# Patient Record
Sex: Female | Born: 1955 | Race: White | Hispanic: No | Smoking: Never smoker
Health system: Southern US, Community
[De-identification: ages and names within clinical notes are randomized; demographics above are authoritative.]

## PROBLEM LIST (undated history)

## (undated) DIAGNOSIS — D649 Anemia, unspecified: Secondary | ICD-10-CM

## (undated) DIAGNOSIS — M26629 Arthralgia of temporomandibular joint, unspecified side: Secondary | ICD-10-CM

## (undated) DIAGNOSIS — I341 Nonrheumatic mitral (valve) prolapse: Secondary | ICD-10-CM

## (undated) DIAGNOSIS — D72820 Lymphocytosis (symptomatic): Secondary | ICD-10-CM

## (undated) DIAGNOSIS — C73 Malignant neoplasm of thyroid gland: Secondary | ICD-10-CM

## (undated) DIAGNOSIS — E7800 Pure hypercholesterolemia, unspecified: Secondary | ICD-10-CM

## (undated) DIAGNOSIS — D239 Other benign neoplasm of skin, unspecified: Secondary | ICD-10-CM

## (undated) DIAGNOSIS — I499 Cardiac arrhythmia, unspecified: Secondary | ICD-10-CM

## (undated) DIAGNOSIS — R1310 Dysphagia, unspecified: Secondary | ICD-10-CM

## (undated) DIAGNOSIS — M8580 Other specified disorders of bone density and structure, unspecified site: Secondary | ICD-10-CM

## (undated) DIAGNOSIS — Z961 Presence of intraocular lens: Secondary | ICD-10-CM

## (undated) DIAGNOSIS — L570 Actinic keratosis: Secondary | ICD-10-CM

## (undated) DIAGNOSIS — E039 Hypothyroidism, unspecified: Secondary | ICD-10-CM

## (undated) DIAGNOSIS — R011 Cardiac murmur, unspecified: Secondary | ICD-10-CM

## (undated) DIAGNOSIS — K219 Gastro-esophageal reflux disease without esophagitis: Secondary | ICD-10-CM

## (undated) DIAGNOSIS — M1990 Unspecified osteoarthritis, unspecified site: Secondary | ICD-10-CM

## (undated) DIAGNOSIS — H3320 Serous retinal detachment, unspecified eye: Secondary | ICD-10-CM

## (undated) HISTORY — DX: Other specified disorders of bone density and structure, unspecified site: M85.80

## (undated) HISTORY — PX: THYROIDECTOMY: SHX17

## (undated) HISTORY — PX: ABDOMINAL HYSTERECTOMY: SHX81

## (undated) HISTORY — DX: Actinic keratosis: L57.0

## (undated) HISTORY — PX: EYE SURGERY: SHX253

## (undated) HISTORY — PX: TONSILLECTOMY: SUR1361

## (undated) HISTORY — DX: Lymphocytosis (symptomatic): D72.820

---
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
## 2006-07-27 ENCOUNTER — Other Ambulatory Visit: Payer: Self-pay

## 2006-07-30 ENCOUNTER — Ambulatory Visit: Payer: Self-pay | Admitting: Obstetrics and Gynecology

## 2006-08-27 ENCOUNTER — Ambulatory Visit: Payer: Self-pay | Admitting: Obstetrics and Gynecology

## 2006-11-15 ENCOUNTER — Ambulatory Visit: Payer: Self-pay | Admitting: Ophthalmology

## 2007-01-16 ENCOUNTER — Emergency Department: Payer: Self-pay | Admitting: Emergency Medicine

## 2007-01-16 ENCOUNTER — Other Ambulatory Visit: Payer: Self-pay

## 2007-12-05 ENCOUNTER — Ambulatory Visit: Payer: Self-pay | Admitting: Gastroenterology

## 2009-09-09 ENCOUNTER — Ambulatory Visit: Payer: Self-pay | Admitting: Family Medicine

## 2009-09-29 ENCOUNTER — Ambulatory Visit: Payer: Self-pay

## 2009-10-13 DIAGNOSIS — C73 Malignant neoplasm of thyroid gland: Secondary | ICD-10-CM

## 2009-10-13 HISTORY — DX: Malignant neoplasm of thyroid gland: C73

## 2010-01-17 ENCOUNTER — Ambulatory Visit: Payer: Self-pay | Admitting: Gastroenterology

## 2010-06-29 ENCOUNTER — Ambulatory Visit: Payer: Self-pay | Admitting: Internal Medicine

## 2010-09-12 ENCOUNTER — Ambulatory Visit: Payer: Self-pay | Admitting: Family Medicine

## 2010-10-23 ENCOUNTER — Ambulatory Visit: Payer: Self-pay | Admitting: Family Medicine

## 2011-09-26 ENCOUNTER — Ambulatory Visit: Payer: Self-pay | Admitting: Family Medicine

## 2011-10-10 ENCOUNTER — Ambulatory Visit: Payer: Self-pay | Admitting: Family Medicine

## 2012-09-26 ENCOUNTER — Ambulatory Visit: Payer: Self-pay | Admitting: Family Medicine

## 2013-11-17 ENCOUNTER — Ambulatory Visit: Payer: Self-pay | Admitting: Family Medicine

## 2014-11-18 ENCOUNTER — Ambulatory Visit: Payer: Self-pay | Admitting: Family Medicine

## 2015-01-14 ENCOUNTER — Ambulatory Visit: Payer: Self-pay | Admitting: Gastroenterology

## 2015-10-15 ENCOUNTER — Encounter: Payer: Self-pay | Admitting: *Deleted

## 2015-10-18 ENCOUNTER — Encounter
Admission: RE | Disposition: A | Discharge status: Home Health | Payer: Self-pay | Source: Ambulatory Visit | Attending: Gastroenterology

## 2015-10-18 ENCOUNTER — Ambulatory Visit
Admission: RE | Admit: 2015-10-18 | Discharge: 2015-10-18 | Disposition: A | Discharge status: Home Health | Payer: BC Managed Care – PPO | Source: Ambulatory Visit | Attending: Gastroenterology | Admitting: Gastroenterology

## 2015-10-18 ENCOUNTER — Ambulatory Visit: Payer: BC Managed Care – PPO | Admitting: Anesthesiology

## 2015-10-18 ENCOUNTER — Encounter: Payer: Self-pay | Admitting: *Deleted

## 2015-10-18 DIAGNOSIS — M19071 Primary osteoarthritis, right ankle and foot: Secondary | ICD-10-CM | POA: Insufficient documentation

## 2015-10-18 DIAGNOSIS — K219 Gastro-esophageal reflux disease without esophagitis: Secondary | ICD-10-CM | POA: Insufficient documentation

## 2015-10-18 DIAGNOSIS — Z86010 Personal history of colonic polyps: Secondary | ICD-10-CM | POA: Insufficient documentation

## 2015-10-18 DIAGNOSIS — M26601 Right temporomandibular joint disorder, unspecified: Secondary | ICD-10-CM | POA: Diagnosis not present

## 2015-10-18 DIAGNOSIS — R1310 Dysphagia, unspecified: Secondary | ICD-10-CM | POA: Diagnosis present

## 2015-10-18 DIAGNOSIS — Z79899 Other long term (current) drug therapy: Secondary | ICD-10-CM | POA: Insufficient documentation

## 2015-10-18 DIAGNOSIS — M19072 Primary osteoarthritis, left ankle and foot: Secondary | ICD-10-CM | POA: Diagnosis not present

## 2015-10-18 DIAGNOSIS — I341 Nonrheumatic mitral (valve) prolapse: Secondary | ICD-10-CM | POA: Insufficient documentation

## 2015-10-18 DIAGNOSIS — Z85850 Personal history of malignant neoplasm of thyroid: Secondary | ICD-10-CM | POA: Insufficient documentation

## 2015-10-18 DIAGNOSIS — M170 Bilateral primary osteoarthritis of knee: Secondary | ICD-10-CM | POA: Diagnosis not present

## 2015-10-18 HISTORY — DX: Gastro-esophageal reflux disease without esophagitis: K21.9

## 2015-10-18 HISTORY — DX: Anemia, unspecified: D64.9

## 2015-10-18 HISTORY — DX: Presence of intraocular lens: Z96.1

## 2015-10-18 HISTORY — DX: Cardiac murmur, unspecified: R01.1

## 2015-10-18 HISTORY — DX: Hypothyroidism, unspecified: E03.9

## 2015-10-18 HISTORY — DX: Arthralgia of temporomandibular joint, unspecified side: M26.629

## 2015-10-18 HISTORY — DX: Serous retinal detachment, unspecified eye: H33.20

## 2015-10-18 HISTORY — DX: Pure hypercholesterolemia, unspecified: E78.00

## 2015-10-18 HISTORY — DX: Cardiac arrhythmia, unspecified: I49.9

## 2015-10-18 HISTORY — DX: Unspecified osteoarthritis, unspecified site: M19.90

## 2015-10-18 HISTORY — DX: Nonrheumatic mitral (valve) prolapse: I34.1

## 2015-10-18 HISTORY — PX: ESOPHAGOGASTRODUODENOSCOPY (EGD) WITH PROPOFOL: SHX5813

## 2015-10-18 HISTORY — DX: Dysphagia, unspecified: R13.10

## 2015-10-18 SURGERY — ESOPHAGOGASTRODUODENOSCOPY (EGD) WITH PROPOFOL
Anesthesia: General

## 2015-10-18 MED ORDER — CEFAZOLIN SODIUM 1-5 GM-% IV SOLN
1.0000 g | Freq: Once | INTRAVENOUS | Status: AC
  Administered 2015-10-18: 1 g via INTRAVENOUS
  Filled 2015-10-18: qty 50

## 2015-10-18 MED ORDER — GLYCOPYRROLATE 0.2 MG/ML IJ SOLN
INTRAMUSCULAR | Status: DC | PRN
  Administered 2015-10-18: 0.2 mg via INTRAVENOUS

## 2015-10-18 MED ORDER — MIDAZOLAM HCL 2 MG/2ML IJ SOLN
INTRAMUSCULAR | Status: DC | PRN
  Administered 2015-10-18: 1 mg via INTRAVENOUS

## 2015-10-18 MED ORDER — PROPOFOL 500 MG/50ML IV EMUL
INTRAVENOUS | Status: DC | PRN
  Administered 2015-10-18: 80 ug/kg/min via INTRAVENOUS

## 2015-10-18 MED ORDER — SODIUM CHLORIDE 0.9 % IV SOLN
INTRAVENOUS | Status: DC
Start: 2015-10-18 — End: 2015-10-18

## 2015-10-18 MED ORDER — SODIUM CHLORIDE 0.9 % IV SOLN
INTRAVENOUS | Status: DC
Start: 2015-10-18 — End: 2015-10-18
  Administered 2015-10-18: 1000 mL via INTRAVENOUS
  Administered 2015-10-18: 11:00:00 via INTRAVENOUS

## 2015-10-18 MED ORDER — PROPOFOL 10 MG/ML IV BOLUS
INTRAVENOUS | Status: DC | PRN
  Administered 2015-10-18: 20 mg via INTRAVENOUS
  Administered 2015-10-18: 10 mg via INTRAVENOUS

## 2015-10-18 NOTE — H&P (Signed)
  Date of Initial H&P:09/24/2015  History reviewed, patient examined, no change in status, stable for surgery.

## 2015-10-18 NOTE — Anesthesia Postprocedure Evaluation (Signed)
Anesthesia Post Note  Patient: Lisa Russell  Procedure(s) Performed: Procedure(s) (LRB): ESOPHAGOGASTRODUODENOSCOPY (EGD) WITH PROPOFOL (N/A)  Patient location during evaluation: Endoscopy Anesthesia Type: General Level of consciousness: awake and alert Pain management: pain level controlled Vital Signs Assessment: post-procedure vital signs reviewed and stable Respiratory status: spontaneous breathing, nonlabored ventilation, respiratory function stable and patient connected to nasal cannula oxygen Cardiovascular status: blood pressure returned to baseline and stable Postop Assessment: no signs of nausea or vomiting Anesthetic complications: no    Last Vitals:  Filed Vitals:   10/18/15 1200 10/18/15 1210  BP: 109/55 109/65  Pulse: 54 57  Temp:    Resp: 15 20    Last Pain: There were no vitals filed for this visit.               Martha Clan

## 2015-10-18 NOTE — Anesthesia Preprocedure Evaluation (Signed)
Anesthesia Evaluation  Patient identified by MRN, date of birth, ID band Patient awake    Reviewed: Allergy & Precautions, H&P , NPO status , Patient's Chart, lab work & pertinent test results, reviewed documented beta blocker date and time   History of Anesthesia Complications Negative for: history of anesthetic complications  Airway Mallampati: II  TM Distance: >3 FB Neck ROM: full    Dental no notable dental hx. (+) Caps, Missing   Pulmonary neg pulmonary ROS,    Pulmonary exam normal breath sounds clear to auscultation       Cardiovascular Exercise Tolerance: Good (-) hypertension(-) angina(-) CAD, (-) Past MI, (-) Cardiac Stents and (-) CABG Normal cardiovascular exam+ dysrhythmias + Valvular Problems/Murmurs MVP  Rhythm:regular Rate:Normal     Neuro/Psych negative neurological ROS  negative psych ROS   GI/Hepatic Neg liver ROS, GERD  Medicated and Poorly Controlled,  Endo/Other  neg diabetesHypothyroidism   Renal/GU negative Renal ROS  negative genitourinary   Musculoskeletal   Abdominal   Peds  Hematology negative hematology ROS (+)   Anesthesia Other Findings Past Medical History:   Anemia                                                       Dysrhythmia                                                  Arthritis                                                    Heart murmur                                                 Mitral valve prolapse                                        Retinal detachment                                           Cancer (HCC)                                                   Comment:thyroid   TMJ arthralgia                                               Hypercholesteremia  GERD (gastroesophageal reflux disease)                       Dysphagia                                                    Pseudophakia of both eyes                                     Hypothyroidism                                               Reproductive/Obstetrics negative OB ROS                             Anesthesia Physical Anesthesia Plan  ASA: II  Anesthesia Plan: General   Post-op Pain Management:    Induction:   Airway Management Planned:   Additional Equipment:   Intra-op Plan:   Post-operative Plan:   Informed Consent: I have reviewed the patients History and Physical, chart, labs and discussed the procedure including the risks, benefits and alternatives for the proposed anesthesia with the patient or authorized representative who has indicated his/her understanding and acceptance.   Dental Advisory Given  Plan Discussed with: Anesthesiologist, CRNA and Surgeon  Anesthesia Plan Comments:         Anesthesia Quick Evaluation

## 2015-10-18 NOTE — Transfer of Care (Signed)
Immediate Anesthesia Transfer of Care Note  Patient: Lisa Russell  Procedure(s) Performed: Procedure(s): ESOPHAGOGASTRODUODENOSCOPY (EGD) WITH PROPOFOL (N/A)  Patient Location: PACU  Anesthesia Type:General  Level of Consciousness: awake, alert  and oriented  Airway & Oxygen Therapy: Patient Spontanous Breathing and Patient connected to nasal cannula oxygen  Post-op Assessment: Report given to RN and Post -op Vital signs reviewed and stable  Post vital signs: Reviewed and stable  Last Vitals:  Filed Vitals:   10/18/15 1005  BP: 118/63  Pulse: 63  Temp: 37 C  Resp: 18    Complications: No apparent anesthesia complications

## 2015-10-18 NOTE — Op Note (Signed)
[facility] Gastroenterology Patient Name: Lisa Russell Procedure Date: 10/18/2015 11:23 AM MRN: [id number] Account #: [id number] Date of Birth: 09-28-1956 Admit Type: Outpatient Age: 59 Room: [facility] ENDO ROOM 4 Gender: Female Note Status: Finalized Procedure:         Upper GI endoscopy Indications:       Suspected esophageal reflux, Symptoms better with protonix Providers:         Lupita Dawn. Candace Cruise, MD Referring MD:      Gayland Curry, MD (Referring MD) Medicines:         Monitored Anesthesia Care Complications:     No immediate complications. Procedure:         Pre-Anesthesia Assessment:                    - Prior to the procedure, a History and Physical was                     performed, and patient medications, allergies and                     sensitivities were reviewed. The patient's tolerance of                     previous anesthesia was reviewed.                    - The risks and benefits of the procedure and the sedation                     options and risks were discussed with the patient. All                     questions were answered and informed consent was obtained.                    - After reviewing the risks and benefits, the patient was                     deemed in satisfactory condition to undergo the procedure.                    After obtaining informed consent, the endoscope was passed                     under direct vision. Throughout the procedure, the                     patient's blood pressure, pulse, and oxygen saturations                     were monitored continuously. The Endoscope was introduced                     through the mouth, and advanced to the second part of                     duodenum. The upper GI endoscopy was accomplished without                     difficulty. The patient tolerated the procedure well. Findings:      The examined esophagus was normal. Biopsies were taken with a cold       forceps for histology.    The entire examined stomach was normal.  The examined duodenum was normal. Impression:        - Normal esophagus. Biopsied.                    - Normal stomach.                    - Normal examined duodenum. Recommendation:    - Discharge patient to home.                    - Observe patient's clinical course.                    - The findings and recommendations were discussed with the                     patient.                    - Await pathology results. Procedure Code(s): --- Professional ---                    [phone], Esophagogastroduodenoscopy, flexible, transoral;                     with biopsy, single or multiple CPT copyright 2014 American Medical Association. All rights reserved. The codes documented in this report are preliminary and upon coder review may  be revised to meet current compliance requirements. Hulen Luster, MD 10/18/2015 11:33:35 AM This report has been signed electronically. Number of Addenda: 0 Note Initiated On: 10/18/2015 11:23 AM      [facility]

## 2015-10-20 LAB — SURGICAL PATHOLOGY

## 2015-11-11 ENCOUNTER — Other Ambulatory Visit: Payer: Self-pay | Admitting: Family Medicine

## 2015-11-11 DIAGNOSIS — Z1231 Encounter for screening mammogram for malignant neoplasm of breast: Secondary | ICD-10-CM

## 2015-11-22 ENCOUNTER — Ambulatory Visit: Payer: BC Managed Care – PPO

## 2015-11-24 ENCOUNTER — Ambulatory Visit
Admission: RE | Admit: 2015-11-24 | Discharge: 2015-11-24 | Disposition: A | Discharge status: Home / Self Care | Payer: BC Managed Care – PPO | Source: Ambulatory Visit | Attending: Family Medicine | Admitting: Family Medicine

## 2015-11-24 DIAGNOSIS — Z1231 Encounter for screening mammogram for malignant neoplasm of breast: Secondary | ICD-10-CM | POA: Diagnosis present

## 2016-08-31 ENCOUNTER — Other Ambulatory Visit
Admission: RE | Admit: 2016-08-31 | Discharge: 2016-08-31 | Disposition: A | Discharge status: Home / Self Care | Payer: BC Managed Care – PPO | Source: Ambulatory Visit | Attending: Gastroenterology | Admitting: Gastroenterology

## 2016-08-31 DIAGNOSIS — R197 Diarrhea, unspecified: Secondary | ICD-10-CM | POA: Insufficient documentation

## 2016-08-31 LAB — GASTROINTESTINAL PANEL BY PCR, STOOL (REPLACES STOOL CULTURE)
ASTROVIRUS: NOT DETECTED
Adenovirus F40/41: NOT DETECTED
Campylobacter species: NOT DETECTED
Cryptosporidium: NOT DETECTED
Cyclospora cayetanensis: NOT DETECTED
E. COLI O157: NOT DETECTED
ENTEROTOXIGENIC E COLI (ETEC): NOT DETECTED
Entamoeba histolytica: NOT DETECTED
Enteroaggregative E coli (EAEC): NOT DETECTED
Enteropathogenic E coli (EPEC): NOT DETECTED
Giardia lamblia: NOT DETECTED
NOROVIRUS GI/GII: NOT DETECTED
Plesimonas shigelloides: NOT DETECTED
ROTAVIRUS A: NOT DETECTED
SAPOVIRUS (I, II, IV, AND V): NOT DETECTED
SHIGA LIKE TOXIN PRODUCING E COLI (STEC): NOT DETECTED
Salmonella species: NOT DETECTED
Shigella/Enteroinvasive E coli (EIEC): NOT DETECTED
Vibrio cholerae: NOT DETECTED
Vibrio species: NOT DETECTED
Yersinia enterocolitica: NOT DETECTED

## 2016-08-31 LAB — C DIFFICILE QUICK SCREEN W PCR REFLEX
C DIFFICILE (CDIFF) INTERP: NOT DETECTED
C DIFFICILE (CDIFF) TOXIN: NEGATIVE
C DIFFICLE (CDIFF) ANTIGEN: NEGATIVE

## 2016-09-03 ENCOUNTER — Encounter: Payer: Self-pay | Admitting: Emergency Medicine

## 2016-09-03 ENCOUNTER — Emergency Department
Admission: EM | Admit: 2016-09-03 | Discharge: 2016-09-03 | Disposition: A | Discharge status: Home / Self Care | Payer: BC Managed Care – PPO | Attending: Emergency Medicine | Admitting: Emergency Medicine

## 2016-09-03 DIAGNOSIS — E039 Hypothyroidism, unspecified: Secondary | ICD-10-CM | POA: Insufficient documentation

## 2016-09-03 DIAGNOSIS — E860 Dehydration: Secondary | ICD-10-CM | POA: Diagnosis present

## 2016-09-03 DIAGNOSIS — R197 Diarrhea, unspecified: Secondary | ICD-10-CM | POA: Diagnosis not present

## 2016-09-03 DIAGNOSIS — Z85850 Personal history of malignant neoplasm of thyroid: Secondary | ICD-10-CM | POA: Diagnosis not present

## 2016-09-03 LAB — URINALYSIS COMPLETE WITH MICROSCOPIC (ARMC ONLY)
Bilirubin Urine: NEGATIVE
Glucose, UA: NEGATIVE mg/dL
Hgb urine dipstick: NEGATIVE
Ketones, ur: NEGATIVE mg/dL
Nitrite: NEGATIVE
PH: 5 (ref 5.0–8.0)
Protein, ur: NEGATIVE mg/dL
Specific Gravity, Urine: 1.004 — ABNORMAL LOW (ref 1.005–1.030)

## 2016-09-03 LAB — CBC
HEMATOCRIT: 41 % (ref 35.0–47.0)
Hemoglobin: 14.5 g/dL (ref 12.0–16.0)
MCH: 31.5 pg (ref 26.0–34.0)
MCHC: 35.3 g/dL (ref 32.0–36.0)
MCV: 89.4 fL (ref 80.0–100.0)
Platelets: 294 10*3/uL (ref 150–440)
RBC: 4.59 MIL/uL (ref 3.80–5.20)
RDW: 12.4 % (ref 11.5–14.5)
WBC: 7.1 10*3/uL (ref 3.6–11.0)

## 2016-09-03 LAB — COMPREHENSIVE METABOLIC PANEL
ALK PHOS: 79 U/L (ref 38–126)
ALT: 12 U/L — AB (ref 14–54)
ANION GAP: 9 (ref 5–15)
AST: 17 U/L (ref 15–41)
Albumin: 4.2 g/dL (ref 3.5–5.0)
BILIRUBIN TOTAL: 0.5 mg/dL (ref 0.3–1.2)
BUN: 9 mg/dL (ref 6–20)
CHLORIDE: 105 mmol/L (ref 101–111)
CO2: 26 mmol/L (ref 22–32)
Calcium: 9.3 mg/dL (ref 8.9–10.3)
Creatinine, Ser: 0.58 mg/dL (ref 0.44–1.00)
GLUCOSE: 103 mg/dL — AB (ref 65–99)
POTASSIUM: 3.2 mmol/L — AB (ref 3.5–5.1)
SODIUM: 140 mmol/L (ref 135–145)
Total Protein: 7.7 g/dL (ref 6.5–8.1)

## 2016-09-03 LAB — TSH: TSH: 4.943 u[IU]/mL — AB (ref 0.350–4.500)

## 2016-09-03 LAB — T4, FREE: FREE T4: 0.84 ng/dL (ref 0.61–1.12)

## 2016-09-03 LAB — LIPASE, BLOOD: Lipase: 17 U/L (ref 11–51)

## 2016-09-03 MED ORDER — DIPHENOXYLATE-ATROPINE 2.5-0.025 MG PO TABS: 1.0000 | ORAL_TABLET | Freq: Four times a day (QID) | ORAL | 1 refills | Status: AC | PRN

## 2016-09-03 MED ORDER — DIPHENOXYLATE-ATROPINE 2.5-0.025 MG PO TABS
2.0000 | ORAL_TABLET | Freq: Once | ORAL | Status: AC
  Administered 2016-09-03: 2 via ORAL
  Filled 2016-09-03: qty 2

## 2016-09-03 NOTE — ED Notes (Signed)
See triage note  States she had lab work and stool studies done  Which were normal  But feels weak  Denies any new food or meds  But has been remodeling an old house

## 2016-09-03 NOTE — ED Provider Notes (Signed)
[facility] Emergency Department Provider Note        Time seen: ----------------------------------------- 2:39 PM on 09/03/2016 -----------------------------------------    I have reviewed the triage vital signs and the nursing notes.   HISTORY  Chief Complaint Dehydration    HPI Lisa Russell is a 60 y.o. female who presents to the ER for weight loss and diarrhea that has been persistent over the past several weeks. Nothing makes it better, taking anything by mouth makes it worse. This is watery, she was just seen by gastroenterology and had a gastrointestinal panel and C. difficile test which were negative. She has recently had increased stress, she been taken Imodium without improvement. He denies any abdominal pain.   Past Medical History:  Diagnosis Date  . Anemia   . Arthritis   . Cancer ([place])    thyroid  . Dysphagia   . Dysrhythmia   . GERD (gastroesophageal reflux disease)   . Heart murmur   . Hypercholesteremia   . Hypothyroidism   . Mitral valve prolapse   . Pseudophakia of both eyes   . Retinal detachment   . TMJ arthralgia     There are no active problems to display for this patient.   Past Surgical History:  Procedure Laterality Date  . ABDOMINAL HYSTERECTOMY    . ESOPHAGOGASTRODUODENOSCOPY (EGD) WITH PROPOFOL N/A 10/18/2015   Procedure: ESOPHAGOGASTRODUODENOSCOPY (EGD) WITH PROPOFOL;  Surgeon: Hulen Luster, MD;  Location: [facility] ENDOSCOPY;  Service: Gastroenterology;  Laterality: N/A;  . EYE SURGERY    . THYROIDECTOMY    . TONSILLECTOMY      Allergies Lexapro [escitalopram oxalate]  Social History Social History  Substance Use Topics  . Smoking status: Never Smoker  . Smokeless tobacco: Never Used  . Alcohol use Not on file    Review of Systems Constitutional: Negative for fever. Cardiovascular: Negative for chest pain. Respiratory: Negative for shortness of breath. Gastrointestinal: Negative for abdominal pain,  vomiting. Positive for diarrhea Genitourinary: Negative for dysuria. Musculoskeletal: Negative for back pain. Skin: Negative for rash. Neurological: Negative for headaches, focal weakness or numbness.  10-point ROS otherwise negative.  ____________________________________________   PHYSICAL EXAM:  VITAL SIGNS: ED Triage Vitals  Enc Vitals Group     BP 09/03/16 1327 102/86     Pulse Rate 09/03/16 1327 66     Resp 09/03/16 1327 20     Temp 09/03/16 1327 98.5 F (36.9 C)     Temp Source 09/03/16 1327 Oral     SpO2 09/03/16 1327 97 %     Weight 09/03/16 1326 170 lb (77.1 kg)     Height 09/03/16 1326 5\' 5"  (1.651 m)     Head Circumference --      Peak Flow --      Pain Score --      Pain Loc --      Pain Edu? --      Excl. in [place]? --     Constitutional: Alert and oriented. Well appearing and in no distress. Eyes: Conjunctivae are normal. PERRL. Normal extraocular movements. ENT   Head: Normocephalic and atraumatic.   Nose: No congestion/rhinnorhea.   Mouth/Throat: Mucous membranes are moist.   Neck: No stridor. Cardiovascular: Normal rate, regular rhythm. No murmurs, rubs, or gallops. Respiratory: Normal respiratory effort without tachypnea nor retractions. Breath sounds are clear and equal bilaterally. No wheezes/rales/rhonchi. Gastrointestinal: Soft and nontender. Normal bowel sounds Musculoskeletal: Nontender with normal range of motion in all extremities. No lower extremity tenderness  nor edema. Neurologic:  Normal speech and language. No gross focal neurologic deficits are appreciated.  Skin:  Skin is warm, dry and intact. No rash noted. Psychiatric: Mood and affect are normal. Speech and behavior are normal.  ___________________________________________  ED COURSE:  Pertinent labs & imaging results that were available during my care of the patient were reviewed by me and considered in my medical decision making (see chart for details). Clinical Course   Patient presents to the ER no acute distress, we will assess with basic labs and reevaluate.  Procedures ____________________________________________   LABS (pertinent positives/negatives)  Labs Reviewed  COMPREHENSIVE METABOLIC PANEL - Abnormal; Notable for the following:       Result Value   Potassium 3.2 (*)    Glucose, Bld 103 (*)    ALT 12 (*)    All other components within normal limits  URINALYSIS COMPLETEWITH MICROSCOPIC ([facility] ONLY) - Abnormal; Notable for the following:    Color, Urine YELLOW (*)    APPearance HAZY (*)    Specific Gravity, Urine 1.004 (*)    Leukocytes, UA TRACE (*)    Bacteria, UA FEW (*)    Squamous Epithelial / LPF 0-5 (*)    All other components within normal limits  TSH - Abnormal; Notable for the following:    TSH 4.943 (*)    All other components within normal limits  LIPASE, BLOOD  CBC  T4, FREE  ____________________________________________  FINAL ASSESSMENT AND PLAN  Diarrhea  Plan: Patient with labs as dictated above. No specific etiology for her diarrhea is identified. Currently she is improved after taking Lomotil. She is clearly under a lot of stress and I have offered anxiolytics which she has declined. She will be placed on Lomotil and referred back to GI for close outpatient follow-up.   Earleen Newport, MD   Note: This dictation was prepared with Dragon dictation. Any transcriptional errors that result from this process are unintentional    Earleen Newport, MD 09/03/16 [id number]

## 2016-09-03 NOTE — ED Triage Notes (Signed)
States she has had weight loss over the past few weeks  Now having diarrhea and feeling weak  States everything she eats or drinks runs thur her   Has been seen by PCP for same

## 2016-09-06 ENCOUNTER — Other Ambulatory Visit
Admission: RE | Admit: 2016-09-06 | Discharge: 2016-09-06 | Disposition: A | Discharge status: Home / Self Care | Payer: BC Managed Care – PPO | Source: Other Acute Inpatient Hospital | Attending: Gastroenterology | Admitting: Gastroenterology

## 2016-09-06 ENCOUNTER — Other Ambulatory Visit: Payer: Self-pay | Admitting: Gastroenterology

## 2016-09-06 DIAGNOSIS — K529 Noninfective gastroenteritis and colitis, unspecified: Secondary | ICD-10-CM | POA: Diagnosis not present

## 2016-09-06 DIAGNOSIS — R634 Abnormal weight loss: Secondary | ICD-10-CM

## 2016-09-08 ENCOUNTER — Ambulatory Visit
Admission: RE | Admit: 2016-09-08 | Discharge: 2016-09-08 | Disposition: A | Discharge status: Home / Self Care | Payer: BC Managed Care – PPO | Source: Ambulatory Visit | Attending: Gastroenterology | Admitting: Gastroenterology

## 2016-09-08 DIAGNOSIS — R634 Abnormal weight loss: Secondary | ICD-10-CM | POA: Insufficient documentation

## 2016-09-08 DIAGNOSIS — K8020 Calculus of gallbladder without cholecystitis without obstruction: Secondary | ICD-10-CM | POA: Diagnosis not present

## 2016-09-08 DIAGNOSIS — N839 Noninflammatory disorder of ovary, fallopian tube and broad ligament, unspecified: Secondary | ICD-10-CM | POA: Insufficient documentation

## 2016-09-08 DIAGNOSIS — K529 Noninfective gastroenteritis and colitis, unspecified: Secondary | ICD-10-CM | POA: Diagnosis present

## 2016-09-08 MED ORDER — IOPAMIDOL (ISOVUE-370) INJECTION 76%
100.0000 mL | Freq: Once | INTRAVENOUS | Status: AC | PRN
  Administered 2016-09-08: 100 mL via INTRAVENOUS

## 2016-09-13 LAB — PANCREATIC ELASTASE, FECAL

## 2016-09-14 ENCOUNTER — Other Ambulatory Visit: Payer: Self-pay | Admitting: Gastroenterology

## 2016-09-14 DIAGNOSIS — N83202 Unspecified ovarian cyst, left side: Secondary | ICD-10-CM

## 2016-09-15 ENCOUNTER — Other Ambulatory Visit: Payer: Self-pay | Admitting: Family Medicine

## 2016-09-15 DIAGNOSIS — Z1231 Encounter for screening mammogram for malignant neoplasm of breast: Secondary | ICD-10-CM

## 2016-09-19 ENCOUNTER — Ambulatory Visit
Admission: RE | Admit: 2016-09-19 | Discharge: 2016-09-19 | Disposition: A | Discharge status: Home / Self Care | Payer: BC Managed Care – PPO | Source: Ambulatory Visit | Attending: Gastroenterology | Admitting: Gastroenterology

## 2016-09-19 DIAGNOSIS — N83202 Unspecified ovarian cyst, left side: Secondary | ICD-10-CM | POA: Diagnosis present

## 2016-09-19 DIAGNOSIS — Z90710 Acquired absence of both cervix and uterus: Secondary | ICD-10-CM | POA: Insufficient documentation

## 2016-09-21 ENCOUNTER — Ambulatory Visit: Payer: BC Managed Care – PPO

## 2016-11-30 ENCOUNTER — Ambulatory Visit: Payer: BC Managed Care – PPO

## 2016-12-08 ENCOUNTER — Encounter: Payer: Self-pay | Admitting: Radiology

## 2016-12-08 ENCOUNTER — Ambulatory Visit
Admission: RE | Admit: 2016-12-08 | Discharge: 2016-12-08 | Disposition: A | Discharge status: Home / Self Care | Payer: BC Managed Care – PPO | Source: Ambulatory Visit | Attending: Family Medicine | Admitting: Family Medicine

## 2016-12-08 DIAGNOSIS — Z1231 Encounter for screening mammogram for malignant neoplasm of breast: Secondary | ICD-10-CM | POA: Diagnosis present

## 2017-07-23 ENCOUNTER — Other Ambulatory Visit: Payer: Self-pay | Admitting: Internal Medicine

## 2017-07-23 DIAGNOSIS — Z1231 Encounter for screening mammogram for malignant neoplasm of breast: Secondary | ICD-10-CM

## 2017-07-31 ENCOUNTER — Other Ambulatory Visit: Payer: Self-pay | Admitting: Gastroenterology

## 2017-07-31 DIAGNOSIS — R1013 Epigastric pain: Secondary | ICD-10-CM

## 2017-08-17 ENCOUNTER — Ambulatory Visit
Admission: RE | Admit: 2017-08-17 | Discharge: 2017-08-17 | Disposition: A | Discharge status: Home / Self Care | Payer: BC Managed Care – PPO | Source: Ambulatory Visit | Attending: Gastroenterology | Admitting: Gastroenterology

## 2017-08-17 DIAGNOSIS — R1013 Epigastric pain: Secondary | ICD-10-CM | POA: Insufficient documentation

## 2017-08-17 MED ORDER — TECHNETIUM TC 99M SULFUR COLLOID FILTERED
1.7500 | Freq: Once | INTRAVENOUS | Status: AC | PRN
  Administered 2017-08-17: 1.75 via INTRADERMAL

## 2017-12-03 ENCOUNTER — Other Ambulatory Visit: Payer: Self-pay | Admitting: Internal Medicine

## 2017-12-13 ENCOUNTER — Ambulatory Visit
Admission: RE | Admit: 2017-12-13 | Discharge: 2017-12-13 | Disposition: A | Discharge status: Home / Self Care | Payer: BC Managed Care – PPO | Source: Ambulatory Visit | Attending: Internal Medicine | Admitting: Internal Medicine

## 2017-12-13 DIAGNOSIS — Z1231 Encounter for screening mammogram for malignant neoplasm of breast: Secondary | ICD-10-CM

## 2017-12-17 ENCOUNTER — Other Ambulatory Visit: Payer: Self-pay | Admitting: Internal Medicine

## 2017-12-17 DIAGNOSIS — R928 Other abnormal and inconclusive findings on diagnostic imaging of breast: Secondary | ICD-10-CM

## 2017-12-27 ENCOUNTER — Ambulatory Visit
Admission: RE | Admit: 2017-12-27 | Discharge: 2017-12-27 | Disposition: A | Discharge status: Home / Self Care | Payer: BC Managed Care – PPO | Source: Ambulatory Visit | Attending: Internal Medicine | Admitting: Internal Medicine

## 2017-12-27 DIAGNOSIS — R928 Other abnormal and inconclusive findings on diagnostic imaging of breast: Secondary | ICD-10-CM | POA: Insufficient documentation

## 2018-01-08 ENCOUNTER — Other Ambulatory Visit: Payer: Self-pay | Admitting: Oncology

## 2018-07-08 ENCOUNTER — Other Ambulatory Visit
Admission: RE | Admit: 2018-07-08 | Discharge: 2018-07-08 | Disposition: A | Discharge status: Home / Self Care | Payer: BC Managed Care – PPO | Source: Ambulatory Visit | Attending: Internal Medicine | Admitting: Internal Medicine

## 2018-07-08 ENCOUNTER — Other Ambulatory Visit: Payer: Self-pay | Admitting: Internal Medicine

## 2018-07-08 ENCOUNTER — Ambulatory Visit
Admission: RE | Admit: 2018-07-08 | Discharge: 2018-07-08 | Disposition: A | Discharge status: Home / Self Care | Payer: BC Managed Care – PPO | Source: Ambulatory Visit | Attending: Internal Medicine | Admitting: Internal Medicine

## 2018-07-08 DIAGNOSIS — R109 Unspecified abdominal pain: Secondary | ICD-10-CM

## 2018-07-08 DIAGNOSIS — R1032 Left lower quadrant pain: Secondary | ICD-10-CM | POA: Insufficient documentation

## 2018-07-08 DIAGNOSIS — K8020 Calculus of gallbladder without cholecystitis without obstruction: Secondary | ICD-10-CM | POA: Insufficient documentation

## 2018-07-08 HISTORY — DX: Malignant neoplasm of thyroid gland: C73

## 2018-07-08 LAB — C DIFFICILE QUICK SCREEN W PCR REFLEX
C DIFFICILE (CDIFF) TOXIN: NEGATIVE
C Diff antigen: NEGATIVE
C Diff interpretation: NOT DETECTED

## 2018-07-08 MED ORDER — IOPAMIDOL (ISOVUE-300) INJECTION 61%
100.0000 mL | Freq: Once | INTRAVENOUS | Status: AC | PRN
  Administered 2018-07-08: 100 mL via INTRAVENOUS

## 2018-07-10 LAB — OVA + PARASITE EXAM

## 2018-07-10 LAB — O&P RESULT

## 2018-11-11 ENCOUNTER — Other Ambulatory Visit: Payer: Self-pay | Admitting: Internal Medicine

## 2018-11-11 DIAGNOSIS — Z1231 Encounter for screening mammogram for malignant neoplasm of breast: Secondary | ICD-10-CM

## 2018-11-13 HISTORY — PX: MOHS SURGERY: SUR867

## 2018-12-20 ENCOUNTER — Ambulatory Visit
Admission: RE | Admit: 2018-12-20 | Discharge: 2018-12-20 | Disposition: A | Discharge status: Home / Self Care | Payer: BC Managed Care – PPO | Source: Ambulatory Visit | Attending: Internal Medicine | Admitting: Internal Medicine

## 2018-12-20 DIAGNOSIS — Z1231 Encounter for screening mammogram for malignant neoplasm of breast: Secondary | ICD-10-CM

## 2019-06-05 DIAGNOSIS — C4491 Basal cell carcinoma of skin, unspecified: Secondary | ICD-10-CM

## 2019-06-05 HISTORY — DX: Basal cell carcinoma of skin, unspecified: C44.91

## 2019-07-15 ENCOUNTER — Other Ambulatory Visit: Payer: Self-pay | Admitting: Internal Medicine

## 2019-07-15 DIAGNOSIS — R109 Unspecified abdominal pain: Secondary | ICD-10-CM

## 2019-07-22 ENCOUNTER — Other Ambulatory Visit: Payer: Medicare Other

## 2019-07-22 ENCOUNTER — Ambulatory Visit: Payer: Medicare Other

## 2019-07-22 ENCOUNTER — Ambulatory Visit
Admission: RE | Admit: 2019-07-22 | Discharge: 2019-07-22 | Disposition: A | Discharge status: Home / Self Care | Payer: Medicare Other | Source: Ambulatory Visit | Attending: Internal Medicine | Admitting: Internal Medicine

## 2019-07-22 ENCOUNTER — Ambulatory Visit: Admission: RE | Admit: 2019-07-22 | Payer: Medicare Other | Source: Ambulatory Visit

## 2019-07-22 ENCOUNTER — Other Ambulatory Visit: Payer: Self-pay

## 2019-07-22 DIAGNOSIS — R109 Unspecified abdominal pain: Secondary | ICD-10-CM | POA: Insufficient documentation

## 2019-07-22 LAB — POCT I-STAT CREATININE: Creatinine, Ser: 0.7 mg/dL (ref 0.44–1.00)

## 2019-07-22 MED ORDER — IOHEXOL 300 MG/ML  SOLN
100.0000 mL | Freq: Once | INTRAMUSCULAR | Status: AC | PRN
  Administered 2019-07-22: 09:00:00 100 mL via INTRAVENOUS

## 2019-07-28 ENCOUNTER — Ambulatory Visit: Payer: BC Managed Care – PPO

## 2019-07-31 DIAGNOSIS — D239 Other benign neoplasm of skin, unspecified: Secondary | ICD-10-CM

## 2019-07-31 HISTORY — DX: Other benign neoplasm of skin, unspecified: D23.9

## 2019-08-20 ENCOUNTER — Other Ambulatory Visit: Payer: Self-pay | Admitting: Internal Medicine

## 2019-08-20 DIAGNOSIS — Z1231 Encounter for screening mammogram for malignant neoplasm of breast: Secondary | ICD-10-CM

## 2019-12-25 ENCOUNTER — Ambulatory Visit
Admission: RE | Admit: 2019-12-25 | Discharge: 2019-12-25 | Disposition: A | Discharge status: Home / Self Care | Payer: Medicare PPO | Source: Ambulatory Visit | Attending: Internal Medicine | Admitting: Internal Medicine

## 2019-12-25 DIAGNOSIS — Z1231 Encounter for screening mammogram for malignant neoplasm of breast: Secondary | ICD-10-CM | POA: Diagnosis present

## 2019-12-31 ENCOUNTER — Other Ambulatory Visit: Payer: Self-pay | Admitting: Internal Medicine

## 2019-12-31 DIAGNOSIS — R928 Other abnormal and inconclusive findings on diagnostic imaging of breast: Secondary | ICD-10-CM

## 2020-01-06 ENCOUNTER — Ambulatory Visit
Admission: RE | Admit: 2020-01-06 | Discharge: 2020-01-06 | Disposition: A | Discharge status: Home / Self Care | Payer: Medicare PPO | Source: Ambulatory Visit | Attending: Internal Medicine | Admitting: Internal Medicine

## 2020-01-06 DIAGNOSIS — R928 Other abnormal and inconclusive findings on diagnostic imaging of breast: Secondary | ICD-10-CM

## 2020-02-02 ENCOUNTER — Ambulatory Visit: Payer: Medicare PPO | Admitting: Dermatology

## 2020-02-02 ENCOUNTER — Encounter: Payer: Self-pay | Admitting: Dermatology

## 2020-02-02 ENCOUNTER — Other Ambulatory Visit: Payer: Self-pay

## 2020-02-02 DIAGNOSIS — L708 Other acne: Secondary | ICD-10-CM

## 2020-02-02 DIAGNOSIS — D225 Melanocytic nevi of trunk: Secondary | ICD-10-CM

## 2020-02-02 DIAGNOSIS — L720 Epidermal cyst: Secondary | ICD-10-CM

## 2020-02-02 DIAGNOSIS — L729 Follicular cyst of the skin and subcutaneous tissue, unspecified: Secondary | ICD-10-CM

## 2020-02-02 DIAGNOSIS — L814 Other melanin hyperpigmentation: Secondary | ICD-10-CM

## 2020-02-02 DIAGNOSIS — D485 Neoplasm of uncertain behavior of skin: Secondary | ICD-10-CM

## 2020-02-02 DIAGNOSIS — L821 Other seborrheic keratosis: Secondary | ICD-10-CM | POA: Diagnosis not present

## 2020-02-02 DIAGNOSIS — Z85828 Personal history of other malignant neoplasm of skin: Secondary | ICD-10-CM | POA: Diagnosis not present

## 2020-02-02 DIAGNOSIS — Z1283 Encounter for screening for malignant neoplasm of skin: Secondary | ICD-10-CM | POA: Diagnosis not present

## 2020-02-02 DIAGNOSIS — D2272 Melanocytic nevi of left lower limb, including hip: Secondary | ICD-10-CM | POA: Diagnosis not present

## 2020-02-02 DIAGNOSIS — Z86018 Personal history of other benign neoplasm: Secondary | ICD-10-CM

## 2020-02-02 DIAGNOSIS — D229 Melanocytic nevi, unspecified: Secondary | ICD-10-CM

## 2020-02-02 DIAGNOSIS — D489 Neoplasm of uncertain behavior, unspecified: Secondary | ICD-10-CM

## 2020-02-02 DIAGNOSIS — D1801 Hemangioma of skin and subcutaneous tissue: Secondary | ICD-10-CM

## 2020-02-02 DIAGNOSIS — D239 Other benign neoplasm of skin, unspecified: Secondary | ICD-10-CM

## 2020-02-02 DIAGNOSIS — L578 Other skin changes due to chronic exposure to nonionizing radiation: Secondary | ICD-10-CM

## 2020-02-02 NOTE — Patient Instructions (Signed)
  Biopsy Wound Care Instructions  1. Leave the original bandage on for 24 hours if possible.  If the bandage becomes soaked or soiled before that time, it is OK to remove it and examine the wound.  A small amount of post-operative bleeding is normal.  If excessive bleeding occurs, remove the bandage, place gauze over the site and apply continuous pressure (no peeking) over the area for 30 minutes. If this does not work, please call our clinic as soon as possible or page your doctor if it is after hours.   2. Once a day, cleanse the wound with soap and water. It is fine to shower. If a thick crust develops you may use a Q-tip dipped into dilute hydrogen peroxide (mix 1:1 with water) to dissolve it.  Hydrogen peroxide can slow the healing process, so use it only as needed.    3. After washing, apply petroleum jelly (Vaseline) or an antibiotic ointment if your doctor prescribed one for you, followed by a bandage.    4. For best healing, the wound should be covered with a layer of ointment at all times. If you are not able to keep the area covered with a bandage to hold the ointment in place, this may mean re-applying the ointment several times a day.  Continue this wound care until the wound has healed and is no longer open.   Itching and mild discomfort is normal during the healing process. However, if you develop pain or severe itching, please call our office.   If you have any discomfort, you can take Tylenol (acetaminophen) or ibuprofen as directed on the bottle. (Please do not take these if you have an allergy to them or cannot take them for another reason).  Some redness, tenderness and white or yellow material in the wound is normal healing.  If the area becomes very sore and red, or develops a thick yellow-green material (pus), it may be infected; please notify us.    If you have stitches, return to clinic as directed to have the stitches removed. You will continue wound care for 2-3 days after  the stitches are removed.   Wound healing continues for up to one year following surgery. It is not unusual to experience pain in the scar from time to time during the interval.  If the pain becomes severe or the scar thickens, you should notify the office.    A slight amount of redness in a scar is expected for the first six months.  After six months, the redness will fade and the scar will soften and fade.  The color difference becomes less noticeable with time.  If there are any problems, return for a post-op surgery check at your earliest convenience.  To improve the appearance of the scar, you can use silicone scar gel, cream, or sheets (such as Mederma or Serica) every night for up to one year. These are available over the counter (without a prescription).  Please call our office for any questions or concerns.

## 2020-02-02 NOTE — Progress Notes (Signed)
Follow-Up Visit   Subjective  Lisa Russell is a 64 y.o. female who presents for the following: TBSE (she would like 2 spots removed today Right inner thigh, Left ingunial crease, discussed previsouly ). Having tingling and burning with tightness at [facility] surgery site.  The following portions of the chart were reviewed this encounter and updated as appropriate:     Review of Systems: No other skin or systemic complaints.  Objective  Well appearing patient in no apparent distress; mood and affect are within normal limits.  A full examination was performed including scalp, head, eyes, ears, nose, lips, neck, chest, axillae, abdomen, back, buttocks, bilateral upper extremities, bilateral lower extremities, hands, feet, fingers, toes, fingernails, and toenails. All findings within normal limits unless otherwise noted below.  Objective  Face, Trunk: Actinic changes   Objective  Perineum/R. Buttocks: Subcutaneous papule/nodule   Objective  Left infranasal: Dilated pore  Objective  Trunk and extremities: Red papules.   Objective  Left paranasal: Well healed scar with no evidence of recurrence at [facility] Moh's surgery site S/P Mohs   Objective  left upper back 3.2cm lat to spine: Scar with no evidence of recurrence. Healing pink biopsy site.   Objective  Trunk and extremities: Scattered tan macules.   Objective  Right Inferior Scapula: 0.5cm irregular brown papule R/O nevus vs dysplastic Shave removal Biopsy  Objective  Left Medial Thigh: 0.5 irregular brown papule R/O irritated nevus vs dysplastic  Objective  Trunk and extremities: Tan-brown and/or pink-flesh-colored symmetric macules and papules.   Objective  Trunk and extremites: Stuck-on, waxy, tan-brown papules and plaques -- Discussed benign etiology and prognosis.   Assessment & Plan  Actinic skin damage Face, Trunk  The nature of sun-induced photo-aging and skin cancers is discussed.  Sun avoidance,  protective clothing, and the use of 30-SPF sunscreens is advised. Observe closely for skin damage/changes, and call if such occurs.   Cyst of skin Perineum/R. Buttocks  Will schedule surgery in future for removal  Dilated pore of [place] Left infranasal  Benign, observe. Discussed surgery options but do not recommend at this time.   Hemangioma of skin Trunk and extremities  Benign, observe.    History of basal cell carcinoma (BCC) Left paranasal  Well healed scar s/p Moh's 07/16/19 healing well discussed that symptoms should improve over time  History of dysplastic nevus left upper back 3.2cm lat to spine  Well healed scar, no evidence of reoccurrence   Lentigines Trunk and extremities  Benign, observe.    Neoplasm of uncertain behavior (2) Right Inferior Scapula  Epidermal / dermal shaving  Lesion length (cm):  0.5 Lesion width (cm):  0.5 Margin per side (cm):  0.2 Total excision diameter (cm):  0.9 Informed consent: discussed and consent obtained   Timeout: patient name, date of birth, surgical site, and procedure verified   Procedure prep:  Patient was prepped and draped in usual sterile fashion Prep type:  Isopropyl alcohol Anesthesia: the lesion was anesthetized in a standard fashion   Anesthetic:  1% lidocaine w/ epinephrine 1-100,000 buffered w/ 8.4% NaHCO3 Instrument used: flexible razor blade   Hemostasis achieved with: pressure, aluminum chloride and electrodesiccation   Outcome: patient tolerated procedure well   Post-procedure details: sterile dressing applied and wound care instructions given   Dressing type: bandage and petrolatum    Specimen 1 - Surgical pathology Differential Diagnosis: R/O nevus vs dysplastic Check Margins: No 0.5cm irregular brown papule R/O irritated nevus vs dysplastic Shave removal Biopsy  Left  Medial Thigh  Epidermal / dermal shaving  Lesion length (cm):  0.5 Lesion width (cm):  0.5 Margin per side (cm):   0.2 Total excision diameter (cm):  0.9 Informed consent: discussed and consent obtained   Timeout: patient name, date of birth, surgical site, and procedure verified   Procedure prep:  Patient was prepped and draped in usual sterile fashion Prep type:  Isopropyl alcohol Anesthesia: the lesion was anesthetized in a standard fashion   Anesthetic:  1% lidocaine w/ epinephrine 1-100,000 buffered w/ 8.4% NaHCO3 Instrument used: flexible razor blade   Hemostasis achieved with: pressure, aluminum chloride and electrodesiccation   Outcome: patient tolerated procedure well   Post-procedure details: sterile dressing applied and wound care instructions given   Dressing type: bandage and petrolatum    Specimen 2 - Surgical pathology Differential Diagnosis: R/o Irritated nevus vs dysplastic Check Margins: No 0.5 irregular brown papule R/O nevus vs dysplastic  Shave removal Biopsy  Nevus Trunk and extremities  Benign, observe.    Seborrheic keratosis Trunk and extremites  Benign.  Observe.   Return in about 3 months (around 05/04/2020) for TBSE and Surgery Appt, return for new or changing spots.   Marene Lenz, CMA, am acting as scribe for Sarina Ser, MD .

## 2020-02-05 ENCOUNTER — Telehealth: Payer: Self-pay

## 2020-02-05 NOTE — Telephone Encounter (Signed)
-----   Message from Ralene Bathe, MD sent at 02/05/2020 11:00 AM EDT ----- These specimens have been switched.  Please have report corrected. The following is the CORRECT information:  Right inferior Scapula = Severe Dysplastic Schedule surgery  Left Medial thigh = benign skin tag

## 2020-02-05 NOTE — Telephone Encounter (Signed)
Discussed results with patient and scheduled her for surgery 03/23/20 at 3:30.

## 2020-02-27 ENCOUNTER — Ambulatory Visit: Payer: Medicare PPO | Admitting: Dermatology

## 2020-03-17 ENCOUNTER — Telehealth: Payer: Self-pay

## 2020-03-17 NOTE — Telephone Encounter (Signed)
Patient called regarding her surgery next Tuesday. She states she possibly has mitral valve prolapse (some physicians hear it and some don't). She wants to know should she start her antibiotic the day of like she does for dental appointments. She has Amoxacillin 500mg  take 4 1 hr before procedure.

## 2020-03-17 NOTE — Telephone Encounter (Signed)
Patient advised of information per Dr. Kowalski.  

## 2020-03-17 NOTE — Telephone Encounter (Signed)
Yes.  Take antibiotic as instructed.

## 2020-03-23 ENCOUNTER — Other Ambulatory Visit: Payer: Self-pay

## 2020-03-23 ENCOUNTER — Ambulatory Visit: Payer: Medicare PPO | Admitting: Dermatology

## 2020-03-23 DIAGNOSIS — D485 Neoplasm of uncertain behavior of skin: Secondary | ICD-10-CM | POA: Diagnosis not present

## 2020-03-23 MED ORDER — MUPIROCIN 2 % EX OINT: 1.0000 "application " | TOPICAL_OINTMENT | Freq: Every day | CUTANEOUS | 0 refills | Status: DC

## 2020-03-23 NOTE — Patient Instructions (Signed)

## 2020-03-23 NOTE — Progress Notes (Signed)
   Follow-Up Visit   Subjective  Lisa Russell is a 64 y.o. female who presents for the following: Procedure (Severe dysplastic nevus of right inf scapula - excise today.).    The following portions of the chart were reviewed this encounter and updated as appropriate:  Tobacco  Allergies  Meds  Problems  Med Hx  Surg Hx  Fam Hx      Review of Systems:  No other skin or systemic complaints except as noted in HPI or Assessment and Plan.  Objective  Well appearing patient in no apparent distress; mood and affect are within normal limits.  A focused examination was performed including back. Relevant physical exam findings are noted in the Assessment and Plan.  Objective  Left inf scapula: Healing biopsy site   Assessment & Plan  Neoplasm of uncertain behavior of skin Left inf scapula  Skin excision  Lesion length (cm):  0.9 Lesion width (cm):  0.7 Margin per side (cm):  0.2 Total excision diameter (cm):  1.3 Informed consent: discussed and consent obtained   Timeout: patient name, date of birth, surgical site, and procedure verified   Procedure prep:  Patient was prepped and draped in usual sterile fashion Prep type:  Isopropyl alcohol and povidone-iodine Anesthesia: the lesion was anesthetized in a standard fashion   Anesthetic:  1% lidocaine w/ epinephrine 1-100,000 buffered w/ 8.4% NaHCO3 Instrument used: #15 blade   Hemostasis achieved with: pressure   Hemostasis achieved with comment:  Electrocautery Outcome: patient tolerated procedure well with no complications   Post-procedure details: sterile dressing applied and wound care instructions given   Dressing type: bandage and pressure dressing (mupirocin)    Skin repair Complexity:  Complex Final length (cm):  4 Reason for type of repair: reduce tension to allow closure, reduce the risk of dehiscence, infection, and necrosis, reduce subcutaneous dead space and avoid a hematoma, allow closure of the large  defect, preserve normal anatomy, preserve normal anatomical and functional relationships and enhance both functionality and cosmetic results   Undermining: area extensively undermined   Undermining comment:  Undermining defect  Subcutaneous layers (deep stitches):  Suture size:  2-0 Suture type: Vicryl (polyglactin 910)   Subcutaneous suture technique: inverted dermal. Fine/surface layer approximation (top stitches):  Suture size:  3-0 Suture type comment:  Nylon Stitches: simple running   Suture removal (days):  7 Hemostasis achieved with: suture and pressure Outcome: patient tolerated procedure well with no complications   Post-procedure details: sterile dressing applied and wound care instructions given   Dressing type: bandage and pressure dressing (mupirocin)    mupirocin ointment (BACTROBAN) 2 %  Specimen 1 - Surgical pathology Differential Diagnosis:  Check Margins: No Healing biopsy site [phone]  Return in about 1 week (around 03/30/2020) for suture removal and TBSE.   I, Ashok Cordia, CMA, am acting as scribe for Sarina Ser, MD .  Documentation: I have reviewed the above documentation for accuracy and completeness, and I agree with the above.  Sarina Ser, MD

## 2020-03-29 ENCOUNTER — Telehealth: Payer: Self-pay

## 2020-03-29 NOTE — Telephone Encounter (Signed)
Pt called for biopsy results, discussed biopsy results with pt

## 2020-03-30 ENCOUNTER — Encounter: Payer: Medicare PPO | Admitting: Dermatology

## 2020-03-30 ENCOUNTER — Other Ambulatory Visit: Payer: Self-pay

## 2020-03-30 ENCOUNTER — Encounter: Payer: Self-pay | Admitting: Dermatology

## 2020-03-30 ENCOUNTER — Ambulatory Visit (INDEPENDENT_AMBULATORY_CARE_PROVIDER_SITE_OTHER): Payer: Medicare PPO | Admitting: Dermatology

## 2020-03-30 DIAGNOSIS — L700 Acne vulgaris: Secondary | ICD-10-CM | POA: Diagnosis not present

## 2020-03-30 DIAGNOSIS — D229 Melanocytic nevi, unspecified: Secondary | ICD-10-CM

## 2020-03-30 DIAGNOSIS — Z86018 Personal history of other benign neoplasm: Secondary | ICD-10-CM

## 2020-03-30 DIAGNOSIS — Z85828 Personal history of other malignant neoplasm of skin: Secondary | ICD-10-CM

## 2020-03-30 DIAGNOSIS — L814 Other melanin hyperpigmentation: Secondary | ICD-10-CM

## 2020-03-30 DIAGNOSIS — L910 Hypertrophic scar: Secondary | ICD-10-CM

## 2020-03-30 DIAGNOSIS — Z1283 Encounter for screening for malignant neoplasm of skin: Secondary | ICD-10-CM | POA: Diagnosis not present

## 2020-03-30 DIAGNOSIS — L578 Other skin changes due to chronic exposure to nonionizing radiation: Secondary | ICD-10-CM

## 2020-03-30 DIAGNOSIS — D1801 Hemangioma of skin and subcutaneous tissue: Secondary | ICD-10-CM

## 2020-03-30 DIAGNOSIS — L821 Other seborrheic keratosis: Secondary | ICD-10-CM

## 2020-03-30 NOTE — Progress Notes (Signed)
Follow-Up Visit   Subjective  Lisa Russell is a 64 y.o. female who presents for the following: post op./suture removal (R inf scapula - margins free severely dysplastic nevus. Other than some irritation from the bandage healing well per patient) and Annual Exam. The patient presents for total-body skin exam for skin cancer screening and mole check.  She has significant anxiety about her spots especially due to her history of skin cancer and these precancer (premelanoma) moles. She has a spot of redness and swelling where she had skin cancer of her left medial infraorbital area.  It has improved recently.   The following portions of the chart were reviewed this encounter and updated as appropriate:  Tobacco  Allergies  Meds  Problems  Med Hx  Surg Hx  Fam Hx     Review of Systems:  No other skin or systemic complaints except as noted in HPI or Assessment and Plan.  Objective  Well appearing patient in no apparent distress; mood and affect are within normal limits.  A full examination was performed including scalp, head, eyes, ears, nose, lips, neck, chest, axillae, abdomen, back, buttocks, bilateral upper extremities, bilateral lower extremities, hands, feet, fingers, toes, fingernails, and toenails. All findings within normal limits unless otherwise noted below.  Objective  R inf scapula: Healing excision site  Objective  L med cheek: With erythema and pink papule   Images     Assessment & Plan  History of dysplastic nevus R inf scapula  Encounter for Removal of Sutures - Incision site at the R inf scapula is clean, dry and intact - Wound cleansed, sutures removed, wound cleansed and steri strips applied.  - Discussed pathology results showing margins free severely dysplastic nevus  - Patient advised to keep steri-strips dry until they fall off. - Scars remodel for a full year. - Once steri-strips fall off, patient can apply over-the-counter silicone scar cream  each night to help with scar remodeling if desired. - Patient advised to call with any concerns or if they notice any new or changing lesions.   Hypertrophic scar L med cheek  At BCC/MOHS site with acne papule - RTC if not resolved in 1 month, samples given of Epiduo Forte apply to aa QHS until resolved. Topical retinoid medications like tretinoin/Retin-A, adapalene/Differin, tazarotene/Fabior, and Epiduo/Epiduo Forte can cause dryness and irritation when first started. Only apply a pea-sized amount to the entire affected area. Avoid applying it around the eyes, edges of mouth and creases at the nose. If you experience irritation, use a good moisturizer first and/or apply the medicine less often. If you are doing well with the medicine, you can increase how often you use it until you are applying every night. Be careful with sun protection while using this medication as it can make you sensitive to the sun. This medicine should not be used by pregnant women.     Lentigines - Scattered tan macules - Discussed due to sun exposure - Benign, observe - Call for any changes  Seborrheic Keratoses - Stuck-on, waxy, tan-brown papules and plaques  - Discussed benign etiology and prognosis. - Observe - Call for any changes  Melanocytic Nevi - Tan-brown and/or pink-flesh-colored symmetric macules and papules - Benign appearing on exam today - Observation - Call clinic for new or changing moles - Recommend daily use of broad spectrum spf 30+ sunscreen to sun-exposed areas.   Hemangiomas - Red papules - Discussed benign nature - Observe - Call for any changes  Actinic Damage - diffuse scaly erythematous macules with underlying dyspigmentation - Recommend daily broad spectrum sunscreen SPF 30+ to sun-exposed areas, reapply every 2 hours as needed.  - Call for new or changing lesions.  Skin cancer screening performed today.   Return in about 2 months (around 05/30/2020) for TBSE - patient  prefers to have another skin check at this appointment.  Luther Redo, CMA, am acting as scribe for Sarina Ser, MD .  Documentation: I have reviewed the above documentation for accuracy and completeness, and I agree with the above.  Sarina Ser, MD

## 2020-03-30 NOTE — Patient Instructions (Signed)

## 2020-03-31 ENCOUNTER — Encounter: Payer: Self-pay | Admitting: Dermatology

## 2020-04-05 ENCOUNTER — Encounter: Payer: Self-pay | Admitting: Dermatology

## 2020-04-06 ENCOUNTER — Encounter: Payer: Medicare PPO | Admitting: Dermatology

## 2020-04-13 ENCOUNTER — Telehealth: Payer: Self-pay

## 2020-04-13 NOTE — Telephone Encounter (Signed)
Patient called regarding her last appointment and biopsy results. She states her after visit summary has the right side was where the excision was but the results from [facility] have the left side. She would like the double checked/correct and can we please follow up with her at [phone].

## 2020-04-15 NOTE — Telephone Encounter (Signed)
I called [facility] and corrected the location from left to right. They will send an addendum when it is completed. I called and left a voicemail for the patient to return my call.

## 2020-04-19 NOTE — Telephone Encounter (Signed)
-----   Message from Ralene Bathe, MD sent at 04/17/2020 12:22 PM EDT ----- Skin (M), right inf scapula NO RESIDUAL DYSPLASTIC NEVUS, MARGINS FREE  Severe dysplastic Margins clear (corrected report)

## 2020-04-19 NOTE — Telephone Encounter (Signed)
LM on VM to return my call. 

## 2020-04-27 ENCOUNTER — Telehealth: Payer: Self-pay

## 2020-04-27 NOTE — Telephone Encounter (Signed)
Called pt to discuss pathology result corrected to the correct location, we are very sorry the pathology had the wrong location, the chart note documentation was correct so we did not have to update the chart. Pt states this was the second time this has happened to her, assured pt we would try our best for this not to happen again.

## 2020-05-05 ENCOUNTER — Other Ambulatory Visit: Payer: Self-pay

## 2020-05-05 ENCOUNTER — Ambulatory Visit: Payer: Medicare PPO | Admitting: Dermatology

## 2020-05-05 DIAGNOSIS — L821 Other seborrheic keratosis: Secondary | ICD-10-CM

## 2020-05-05 DIAGNOSIS — D485 Neoplasm of uncertain behavior of skin: Secondary | ICD-10-CM | POA: Diagnosis not present

## 2020-05-05 DIAGNOSIS — L720 Epidermal cyst: Secondary | ICD-10-CM | POA: Diagnosis not present

## 2020-05-05 DIAGNOSIS — L700 Acne vulgaris: Secondary | ICD-10-CM

## 2020-05-05 DIAGNOSIS — D229 Melanocytic nevi, unspecified: Secondary | ICD-10-CM | POA: Diagnosis not present

## 2020-05-05 DIAGNOSIS — Z1283 Encounter for screening for malignant neoplasm of skin: Secondary | ICD-10-CM

## 2020-05-05 DIAGNOSIS — D1800 Hemangioma unspecified site: Secondary | ICD-10-CM

## 2020-05-05 DIAGNOSIS — L814 Other melanin hyperpigmentation: Secondary | ICD-10-CM

## 2020-05-05 DIAGNOSIS — Z85828 Personal history of other malignant neoplasm of skin: Secondary | ICD-10-CM

## 2020-05-05 DIAGNOSIS — L578 Other skin changes due to chronic exposure to nonionizing radiation: Secondary | ICD-10-CM

## 2020-05-05 DIAGNOSIS — Z86018 Personal history of other benign neoplasm: Secondary | ICD-10-CM

## 2020-05-05 NOTE — Patient Instructions (Signed)

## 2020-05-05 NOTE — Progress Notes (Signed)
Follow-Up Visit   Subjective  Lisa Russell is a 64 y.o. female who presents for the following: Annual Exam (patient prefers to have frequent TBSE's due to history of skin cancer and dysplastic nevi. The papule noted on her previous visit did improve with Epiduo Forte)  The patient presents for Total-Body Skin Exam (TBSE) for skin cancer screening and mole check.  The following portions of the chart were reviewed this encounter and updated as appropriate:  Tobacco  Allergies  Meds  Problems  Med Hx  Surg Hx  Fam Hx     Review of Systems:  No other skin or systemic complaints except as noted in HPI or Assessment and Plan.  Objective  Well appearing patient in no apparent distress; mood and affect are within normal limits.  A full examination was performed including scalp, head, eyes, ears, nose, lips, neck, chest, axillae, abdomen, back, buttocks, bilateral upper extremities, bilateral lower extremities, hands, feet, fingers, toes, fingernails, and toenails. All findings within normal limits unless otherwise noted below.  Objective  numerous see history: Scar with no evidence of recurrence.   Objective  Face: [place]   Objective  Face: Smooth white papule(s).   Objective  L medial canthus: Pink papule 0.5 cm      Assessment & Plan    History of dysplastic nevus numerous see history  Clear. Observe for recurrence. Call clinic for new or changing lesions.  Recommend regular skin exams, daily broad-spectrum spf 30+ sunscreen use, and photoprotection.     Acne vulgaris Face  Continue Epiduo Forte samples QHS  Milia Face  Continue Epiduo Forte QHS   Neoplasm of uncertain behavior of skin L medial canthus/ eyelid  Skin / nail biopsy Type of biopsy: tangential   Informed consent: discussed and consent obtained   Timeout: patient name, date of birth, surgical site, and procedure verified   Procedure prep:  Patient was prepped and draped in usual  sterile fashion Prep type:  Isopropyl alcohol Anesthesia: the lesion was anesthetized in a standard fashion   Anesthetic:  1% lidocaine w/ epinephrine 1-100,000 buffered w/ 8.4% NaHCO3 Instrument used: flexible razor blade   Hemostasis achieved with: pressure, aluminum chloride and electrodesiccation   Outcome: patient tolerated procedure well   Post-procedure details: sterile dressing applied and wound care instructions given   Dressing type: bandage and petrolatum    Specimen 1 - Surgical pathology Differential Diagnosis: r/o acne papule vs BCC vs other Check Margins: No Pink papule 0.5 cm  Other Related Medications mupirocin ointment (BACTROBAN) 2 %   Lentigines - Scattered tan macules - Discussed due to sun exposure - Benign, observe - Call for any changes  Seborrheic Keratoses - Stuck-on, waxy, tan-brown papules and plaques  - Discussed benign etiology and prognosis. - Observe - Call for any changes  Melanocytic Nevi  - Tan-brown and/or pink-flesh-colored symmetric macules and papules - Benign appearing on exam today - Observation - Call clinic for new or changing moles - Recommend daily use of broad spectrum spf 30+ sunscreen to sun-exposed areas.   Hemangiomas - Red papules - Discussed benign nature - Observe - Call for any changes  Actinic Damage - diffuse scaly erythematous macules with underlying dyspigmentation - Recommend daily broad spectrum sunscreen SPF 30+ to sun-exposed areas, reapply every 2 hours as needed.  - Call for new or changing lesions.  History of Basal Cell Carcinoma of the Skin L medial cheek - No evidence of recurrence today - Recommend regular full body skin  exams - Recommend daily broad spectrum sunscreen SPF 30+ to sun-exposed areas, reapply every 2 hours as needed.  - Call if any new or changing lesions are noted between office visits  Skin cancer screening performed today.   Return in about 6 months (around 11/04/2020) for  TBSE - hx BCC, dysplastic nevi.  Luther Redo, CMA, am acting as scribe for Sarina Ser, MD .  Documentation: I have reviewed the above documentation for accuracy and completeness, and I agree with the above.  Sarina Ser, MD

## 2020-05-10 ENCOUNTER — Telehealth: Payer: Self-pay

## 2020-05-10 NOTE — Telephone Encounter (Signed)
Patient informed of pathology results 

## 2020-05-10 NOTE — Telephone Encounter (Signed)
-----   Message from Ralene Bathe, MD sent at 05/10/2020 11:40 AM EDT ----- Skin , left medial canthus KERATIN GRANULOMA  Benign keratin granuloma Usually from inflamed cyst No further treatment

## 2020-05-11 ENCOUNTER — Encounter: Payer: Self-pay | Admitting: Dermatology

## 2020-08-31 ENCOUNTER — Other Ambulatory Visit: Payer: Self-pay | Admitting: Internal Medicine

## 2020-08-31 DIAGNOSIS — Z1231 Encounter for screening mammogram for malignant neoplasm of breast: Secondary | ICD-10-CM

## 2020-09-29 ENCOUNTER — Other Ambulatory Visit: Payer: Self-pay

## 2020-09-29 ENCOUNTER — Ambulatory Visit: Payer: Medicare PPO | Admitting: Dermatology

## 2020-09-29 DIAGNOSIS — L650 Telogen effluvium: Secondary | ICD-10-CM | POA: Diagnosis not present

## 2020-09-29 DIAGNOSIS — Z86018 Personal history of other benign neoplasm: Secondary | ICD-10-CM

## 2020-09-29 DIAGNOSIS — D1800 Hemangioma unspecified site: Secondary | ICD-10-CM

## 2020-09-29 DIAGNOSIS — Z1283 Encounter for screening for malignant neoplasm of skin: Secondary | ICD-10-CM | POA: Diagnosis not present

## 2020-09-29 DIAGNOSIS — L814 Other melanin hyperpigmentation: Secondary | ICD-10-CM | POA: Diagnosis not present

## 2020-09-29 DIAGNOSIS — L578 Other skin changes due to chronic exposure to nonionizing radiation: Secondary | ICD-10-CM

## 2020-09-29 DIAGNOSIS — Z85828 Personal history of other malignant neoplasm of skin: Secondary | ICD-10-CM | POA: Diagnosis not present

## 2020-09-29 DIAGNOSIS — D229 Melanocytic nevi, unspecified: Secondary | ICD-10-CM

## 2020-09-29 DIAGNOSIS — L821 Other seborrheic keratosis: Secondary | ICD-10-CM

## 2020-09-29 NOTE — Progress Notes (Signed)
   Follow-Up Visit   Subjective  Lisa Russell is a 64 y.o. female who presents for the following: Annual Exam (6 month follow up - History of BCC and Dysplastic nevi - TBSE today). The patient presents for Total-Body Skin Exam (TBSE) for skin cancer screening and mole check.  Patient here for full body skin exam and skin cancer screening.  The following portions of the chart were reviewed this encounter and updated as appropriate:  Tobacco  Allergies  Meds  Problems  Med Hx  Surg Hx  Fam Hx     Review of Systems:  No other skin or systemic complaints except as noted in HPI or Assessment and Plan.  Objective  Well appearing patient in no apparent distress; mood and affect are within normal limits.  A full examination was performed including scalp, head, eyes, ears, nose, lips, neck, chest, axillae, abdomen, back, buttocks, bilateral upper extremities, bilateral lower extremities, hands, feet, fingers, toes, fingernails, and toenails. All findings within normal limits unless otherwise noted below.  Objective  Left medial cheek: Well healed scar with no evidence of recurrence.   Objective  Mid Frontal Scalp: Diffuse thinning of hair, positive hair pull test.    Assessment & Plan    Lentigines - Scattered tan macules - Discussed due to sun exposure - Benign, observe - Call for any changes  Seborrheic Keratoses - Stuck-on, waxy, tan-brown papules and plaques  - Discussed benign etiology and prognosis. - Observe - Call for any changes  Melanocytic Nevi - Tan-brown and/or pink-flesh-colored symmetric macules and papules - Benign appearing on exam today - Observation - Call clinic for new or changing moles - Recommend daily use of broad spectrum spf 30+ sunscreen to sun-exposed areas.   Hemangiomas - Red papules - Discussed benign nature - Observe - Call for any changes  Actinic Damage - Chronic, secondary to cumulative UV/sun exposure - diffuse scaly  erythematous macules with underlying dyspigmentation - Recommend daily broad spectrum sunscreen SPF 30+ to sun-exposed areas, reapply every 2 hours as needed.  - Call for new or changing lesions.  Skin cancer screening performed today.  History of Dysplastic Nevi - No evidence of recurrence today - Recommend regular full body skin exams - Recommend daily broad spectrum sunscreen SPF 30+ to sun-exposed areas, reapply every 2 hours as needed.  - Call if any new or changing lesions are noted between office visits  History of basal cell carcinoma (BCC) Left medial cheek  Clear. Observe for recurrence. Call clinic for new or changing lesions.  Recommend regular skin exams, daily broad-spectrum spf 30+ sunscreen use, and photoprotection.    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 Seems to be recovering now. Continue Biotin daily.  Discussed red light treatments. Information given. Also discussed Rogaine.  She has a history of thyroid cancer so thyroid labs are monitored regularly. Reviewed thyroid lab.  Skin cancer screening  Return in about 6 months (around 03/29/2021) for TBSE.  I, Ashok Cordia, CMA, am acting as scribe for Sarina Ser, MD .  Documentation: I have reviewed the above documentation for accuracy and completeness, and I agree with the above.  Sarina Ser, MD

## 2020-10-04 ENCOUNTER — Encounter: Payer: Self-pay | Admitting: Dermatology

## 2021-01-06 ENCOUNTER — Ambulatory Visit
Admission: RE | Admit: 2021-01-06 | Discharge: 2021-01-06 | Disposition: A | Discharge status: Home / Self Care | Payer: Medicare PPO | Source: Ambulatory Visit | Attending: Internal Medicine | Admitting: Internal Medicine

## 2021-01-06 ENCOUNTER — Other Ambulatory Visit: Payer: Self-pay

## 2021-01-06 DIAGNOSIS — Z1231 Encounter for screening mammogram for malignant neoplasm of breast: Secondary | ICD-10-CM | POA: Diagnosis present

## 2021-04-06 ENCOUNTER — Other Ambulatory Visit: Payer: Self-pay

## 2021-04-06 ENCOUNTER — Encounter: Payer: Self-pay | Admitting: Dermatology

## 2021-04-06 ENCOUNTER — Ambulatory Visit: Payer: Medicare PPO | Admitting: Dermatology

## 2021-04-06 DIAGNOSIS — Z86018 Personal history of other benign neoplasm: Secondary | ICD-10-CM | POA: Diagnosis not present

## 2021-04-06 DIAGNOSIS — L814 Other melanin hyperpigmentation: Secondary | ICD-10-CM

## 2021-04-06 DIAGNOSIS — D229 Melanocytic nevi, unspecified: Secondary | ICD-10-CM

## 2021-04-06 DIAGNOSIS — Z85828 Personal history of other malignant neoplasm of skin: Secondary | ICD-10-CM

## 2021-04-06 DIAGNOSIS — Z1283 Encounter for screening for malignant neoplasm of skin: Secondary | ICD-10-CM

## 2021-04-06 DIAGNOSIS — D1800 Hemangioma unspecified site: Secondary | ICD-10-CM

## 2021-04-06 DIAGNOSIS — L821 Other seborrheic keratosis: Secondary | ICD-10-CM

## 2021-04-06 DIAGNOSIS — L578 Other skin changes due to chronic exposure to nonionizing radiation: Secondary | ICD-10-CM | POA: Diagnosis not present

## 2021-04-06 NOTE — Patient Instructions (Signed)

## 2021-04-06 NOTE — Progress Notes (Signed)
   Follow-Up Visit   Subjective  Lisa Russell is a 65 y.o. female who presents for the following: Total body skin exam (Hx of BCC L paranasal, hx of Dysplastic nevi). The patient presents for Total-Body Skin Exam (TBSE) for skin cancer screening and mole check.  The following portions of the chart were reviewed this encounter and updated as appropriate:   Tobacco  Allergies  Meds  Problems  Med Hx  Surg Hx  Fam Hx     Review of Systems:  No other skin or systemic complaints except as noted in HPI or Assessment and Plan.  Objective  Well appearing patient in no apparent distress; mood and affect are within normal limits.  A full examination was performed including scalp, head, eyes, ears, nose, lips, neck, chest, axillae, abdomen, back, buttocks, bilateral upper extremities, bilateral lower extremities, hands, feet, fingers, toes, fingernails, and toenails. All findings within normal limits unless otherwise noted below.  Objective  L paranasal: Well healed scar with no evidence of recurrence.   Objective  Left Upper Back 3.5cm lat to spine, R inf scapula: Scars with no evidence of recurrence.    Assessment & Plan    Lentigines - Scattered tan macules - Due to sun exposure - Benign-appering, observe - Recommend daily broad spectrum sunscreen SPF 30+ to sun-exposed areas, reapply every 2 hours as needed. - Call for any changes  Seborrheic Keratoses - Stuck-on, waxy, tan-brown papules and/or plaques  - Benign-appearing - Discussed benign etiology and prognosis. - Observe - Call for any changes  Melanocytic Nevi - Tan-brown and/or pink-flesh-colored symmetric macules and papules - Benign appearing on exam today - Observation - Call clinic for new or changing moles - Recommend daily use of broad spectrum spf 30+ sunscreen to sun-exposed areas.   Hemangiomas - Red papules - Discussed benign nature - Observe - Call for any changes  Actinic Damage - Chronic  condition, secondary to cumulative UV/sun exposure - diffuse scaly erythematous macules with underlying dyspigmentation - Recommend daily broad spectrum sunscreen SPF 30+ to sun-exposed areas, reapply every 2 hours as needed.  - Staying in the shade or wearing long sleeves, sun glasses (UVA+UVB protection) and wide brim hats (4-inch brim around the entire circumference of the hat) are also recommended for sun protection.  - Call for new or changing lesions.  Skin cancer screening performed today.  Return in about 6 months (around 10/07/2021) for TBSE, Hx of BCC, Hx of Dysplastic nevi.   I, Othelia Pulling, RMA, am acting as scribe for Sarina Ser, MD .  Documentation: I have reviewed the above documentation for accuracy and completeness, and I agree with the above.  Sarina Ser, MD

## 2021-04-09 ENCOUNTER — Encounter: Payer: Self-pay | Admitting: Dermatology

## 2021-09-26 ENCOUNTER — Ambulatory Visit: Payer: Medicare PPO | Admitting: Dermatology

## 2021-11-15 ENCOUNTER — Other Ambulatory Visit: Payer: Self-pay | Admitting: Internal Medicine

## 2021-11-15 DIAGNOSIS — Z1231 Encounter for screening mammogram for malignant neoplasm of breast: Secondary | ICD-10-CM

## 2021-12-12 ENCOUNTER — Ambulatory Visit: Payer: Medicare PPO | Admitting: Dermatology

## 2021-12-12 ENCOUNTER — Other Ambulatory Visit: Payer: Self-pay

## 2021-12-12 DIAGNOSIS — L853 Xerosis cutis: Secondary | ICD-10-CM

## 2021-12-12 DIAGNOSIS — D229 Melanocytic nevi, unspecified: Secondary | ICD-10-CM

## 2021-12-12 DIAGNOSIS — L821 Other seborrheic keratosis: Secondary | ICD-10-CM

## 2021-12-12 DIAGNOSIS — Z86018 Personal history of other benign neoplasm: Secondary | ICD-10-CM

## 2021-12-12 DIAGNOSIS — Z1283 Encounter for screening for malignant neoplasm of skin: Secondary | ICD-10-CM | POA: Diagnosis not present

## 2021-12-12 DIAGNOSIS — D692 Other nonthrombocytopenic purpura: Secondary | ICD-10-CM | POA: Diagnosis not present

## 2021-12-12 DIAGNOSIS — L578 Other skin changes due to chronic exposure to nonionizing radiation: Secondary | ICD-10-CM | POA: Diagnosis not present

## 2021-12-12 DIAGNOSIS — Z85828 Personal history of other malignant neoplasm of skin: Secondary | ICD-10-CM | POA: Diagnosis not present

## 2021-12-12 DIAGNOSIS — D1800 Hemangioma unspecified site: Secondary | ICD-10-CM

## 2021-12-12 DIAGNOSIS — L814 Other melanin hyperpigmentation: Secondary | ICD-10-CM

## 2021-12-12 NOTE — Patient Instructions (Addendum)
Gentle Skin Care Guide  1. Bathe no more than once a day.  2. Avoid bathing in hot water  3. Use a mild soap like Dove, Vanicream, Cetaphil, CeraVe. Can use Lever 2000 or Cetaphil antibacterial soap  4. Use soap only where you need it. On most days, use it under your arms, between your legs, and on your feet. Let the water rinse other areas unless visibly dirty.  5. When you get out of the bath/shower, use a towel to gently blot your skin dry, don't rub it.  6. While your skin is still a little damp, apply a moisturizing cream such as Vanicream, CeraVe, Cetaphil, Eucerin, Sarna lotion or plain Vaseline Jelly. For hands apply Neutrogena [place] Hand Cream or Excipial Hand Cream.  7. Reapply moisturizer any time you start to itch or feel dry.  8. Sometimes using free and clear laundry detergents can be helpful. Fabric softener sheets should be avoided. Downy Free & Gentle liquid, or any liquid fabric softener that is free of dyes and perfumes, it acceptable to use  9. If your doctor has given you prescription creams you may apply moisturizers over them      Melanoma ABCDEs  Melanoma is the most dangerous type of skin cancer, and is the leading cause of death from skin disease.  You are more likely to develop melanoma if you: Have light-colored skin, light-colored eyes, or red or blond hair Spend a lot of time in the sun Tan regularly, either outdoors or in a tanning bed Have had blistering sunburns, especially during childhood Have a close family member who has had a melanoma Have atypical moles or large birthmarks  Early detection of melanoma is key since treatment is typically straightforward and cure rates are extremely high if we catch it early.   The first sign of melanoma is often a change in a mole or a new dark spot.  The ABCDE system is a way of remembering the signs of melanoma.  A for asymmetry:  The two halves do not match. B for border:  The edges of the growth are  irregular. C for color:  A mixture of colors are present instead of an even brown color. D for diameter:  Melanomas are usually (but not always) greater than 12mm - the size of a pencil eraser. E for evolution:  The spot keeps changing in size, shape, and color.  Please check your skin once per month between visits. You can use a small mirror in front and a large mirror behind you to keep an eye on the back side or your body.   If you see any new or changing lesions before your next follow-up, please call to schedule a visit.  Please continue daily skin protection including broad spectrum sunscreen SPF 30+ to sun-exposed areas, reapplying every 2 hours as needed when you're outdoors.   Staying in the shade or wearing long sleeves, sun glasses (UVA+UVB protection) and wide brim hats (4-inch brim around the entire circumference of the hat) are also recommended for sun protection.             If You Need Anything After Your Visit  If you have any questions or concerns for your doctor, please call our main line at [phone] and press option 4 to reach your doctor's medical assistant. If no one answers, please leave a voicemail as directed and we will return your call as soon as possible. Messages left after 4 pm will be answered the  following business day.   You may also send [place] a message via [place]. We typically respond to MyChart messages within 1-2 business days.  For prescription refills, please ask your pharmacy to contact our office. Our fax number is [phone].  If you have an urgent issue when the clinic is closed that cannot wait until the next business day, you can page your doctor at the number below.    Please note that while we do our best to be available for urgent issues outside of office hours, we are not available 24/7.   If you have an urgent issue and are unable to reach [place], you may choose to seek medical care at your doctor's office, retail clinic, urgent care  center, or emergency room.  If you have a medical emergency, please immediately call 911 or go to the emergency department.  Pager Numbers  - Dr. Nehemiah Massed: [phone]  - Dr. Laurence Ferrari: [phone]  - Dr. Nicole Kindred: [phone]  In the event of inclement weather, please call our main line at [phone] for an update on the status of any delays or closures.  Dermatology Medication Tips: Please keep the boxes that topical medications come in in order to help keep track of the instructions about where and how to use these. Pharmacies typically print the medication instructions only on the boxes and not directly on the medication tubes.   If your medication is too expensive, please contact our office at [phone] option 4 or send [place] a message through [place].   We are unable to tell what your co-pay for medications will be in advance as this is different depending on your insurance coverage. However, we may be able to find a substitute medication at lower cost or fill out paperwork to get insurance to cover a needed medication.   If a prior authorization is required to get your medication covered by your insurance company, please allow [place] 1-2 business days to complete this process.  Drug prices often vary depending on where the prescription is filled and some pharmacies may offer cheaper prices.  The website www.goodrx.com contains coupons for medications through different pharmacies. The prices here do not account for what the cost may be with help from insurance (it may be cheaper with your insurance), but the website can give you the price if you did not use any insurance.  - You can print the associated coupon and take it with your prescription to the pharmacy.  - You may also stop by our office during regular business hours and pick up a GoodRx coupon card.  - If you need your prescription sent electronically to a different pharmacy, notify our office through [facility] or by  phone at [phone] option 4.     Si Usted Necesita Algo Despus de Su Visita  Tambin puede enviarnos un mensaje a travs de Pharmacist, community. Por lo general respondemos a los mensajes de MyChart en el transcurso de 1 a 2 das hbiles.  Para renovar recetas, por favor pida a su farmacia que se ponga en contacto con nuestra oficina. Harland Dingwall de fax es [place] [phone].  Si tiene un asunto urgente cuando la clnica est cerrada y que no puede esperar hasta el siguiente da hbil, puede llamar/localizar a su doctor(a) al nmero que aparece a continuacin.   Por favor, tenga en cuenta que aunque hacemos todo lo posible para estar disponibles para asuntos urgentes fuera del horario de oficina, no estamos disponibles las 24 horas del da, los 7  das de [employer].   Si tiene un problema urgente y no puede comunicarse con nosotros, puede optar por buscar atencin mdica  en el consultorio de su doctor(a), en una clnica privada, en un centro de atencin urgente o en una sala de emergencias.  Si tiene Engineering geologist, por favor llame inmediatamente al 911 o vaya a la sala de emergencias.  Nmeros de bper  - Dr. Nehemiah Massed: [phone]  - Dra. Moye: [phone]  - Dra. Nicole Kindred: [phone]  En caso de inclemencias del [place], por favor llame a Johnsie Kindred principal al [phone] para una actualizacin sobre el [place] de cualquier retraso o cierre.  Consejos para la medicacin en dermatologa: Por favor, guarde las cajas en las que vienen los medicamentos de uso tpico para ayudarle a seguir las instrucciones sobre dnde y cmo usarlos. Las farmacias generalmente imprimen las instrucciones del medicamento slo en las cajas y no directamente en los tubos del [place].   Si su medicamento es muy caro, por favor, pngase en contacto con Zigmund Daniel llamando al [phone] y presione la opcin 4 o envenos un mensaje a travs de Pharmacist, community.   No podemos decirle cul ser su copago  por los medicamentos por adelantado ya que esto es diferente dependiendo de la cobertura de su seguro. Sin embargo, es posible que podamos encontrar un medicamento sustituto a Electrical engineer un formulario para que el seguro cubra el medicamento que se considera necesario.   Si se requiere una autorizacin previa para que su compaa de seguros [place] su medicamento, por favor permtanos de 1 a 2 das hbiles para completar este proceso.  Los precios de los medicamentos varan con frecuencia dependiendo del Environmental consultant de dnde se surte la receta y alguna farmacias pueden ofrecer precios ms baratos.  El sitio web www.goodrx.com tiene cupones para medicamentos de Airline pilot. Los precios aqu no tienen en cuenta lo que podra costar con la ayuda del seguro (puede ser ms barato con su seguro), pero el sitio web puede darle el precio si no utiliz Research scientist (physical sciences).  - Puede imprimir el cupn correspondiente y llevarlo con su receta a la farmacia.  - Tambin puede pasar por nuestra oficina durante el horario de atencin regular y Charity fundraiser una tarjeta de cupones de GoodRx.  - Si necesita que su receta se enve electrnicamente a una farmacia diferente, informe a nuestra oficina a travs de MyChart de [place] o por telfono llamando al [phone] y presione la opcin 4.

## 2021-12-12 NOTE — Progress Notes (Signed)
Follow-Up Visit   Subjective  Lisa Russell is a 66 y.o. female who presents for the following: Follow-up (Patient had mohs surgery on left paranasal area.  Patient reports having some sensations at area. Patient reports surgeon suggested she follow up with dermatology. Patient reports a spot at left chin and left umbilical area. ). The patient presents for Total-Body Skin Exam (TBSE) for skin cancer screening and mole check.  The patient has spots, moles and lesions to be evaluated, some may be new or changing and the patient has concerns that these could be cancer.  The following portions of the chart were reviewed this encounter and updated as appropriate:  Tobacco   Allergies   Meds   Problems   Med Hx   Surg Hx   Fam Hx      Review of Systems: No other skin or systemic complaints except as noted in HPI or Assessment and Plan.  Objective  Well appearing patient in no apparent distress; mood and affect are within normal limits.  A full examination was performed including scalp, head, eyes, ears, nose, lips, neck, chest, axillae, abdomen, back, buttocks, bilateral upper extremities, bilateral lower extremities, hands, feet, fingers, toes, fingernails, and toenails. All findings within normal limits unless otherwise noted below.   Assessment & Plan  Skin cancer screening  Lentigines - Scattered tan macules - Due to sun exposure - Benign-appearing, observe - Recommend daily broad spectrum sunscreen SPF 30+ to sun-exposed areas, reapply every 2 hours as needed. - Call for any changes  Seborrheic Keratoses - Stuck-on, waxy, tan-brown papules and/or plaques  - Benign-appearing - Discussed benign etiology and prognosis. - Observe - Call for any changes  Melanocytic Nevi - Tan-brown and/or pink-flesh-colored symmetric macules and papules at left chin  - Benign appearing on exam today - Observation - Call clinic for new or changing moles - Recommend daily use of broad spectrum  spf 30+ sunscreen to sun-exposed areas.   Purpura - Chronic; persistent and recurrent.  Treatable, but not curable. - Violaceous macules and patches - Benign - Related to trauma, age, sun damage and/or use of blood thinners, chronic use of topical and/or oral steroids - Observe - Can use OTC arnica containing moisturizer such as Dermend Bruise Formula if desired - Call for worsening or other concerns  Xerosis - diffuse xerotic patches - recommend gentle, hydrating skin care - gentle skin care handout given  Hemangiomas - Red papules - Discussed benign nature - Observe - Call for any changes  Actinic Damage - Chronic condition, secondary to cumulative UV/sun exposure - diffuse scaly erythematous macules with underlying dyspigmentation - Recommend daily broad spectrum sunscreen SPF 30+ to sun-exposed areas, reapply every 2 hours as needed.  - Staying in the shade or wearing long sleeves, sun glasses (UVA+UVB protection) and wide brim hats (4-inch brim around the entire circumference of the hat) are also recommended for sun protection.  - Call for new or changing lesions.  History of Basal Cell Carcinoma of the Skin - No evidence of recurrence today  left paranasal 2020 (mohs surgery Dr. Lacinda Axon)  -No evidence of recurrence by visual exam and palpation   - no lymphanopathy  -Symptoms due to lymph changes and post surgery  - Recommend regular full body skin exams - Recommend daily broad spectrum sunscreen SPF 30+ to sun-exposed areas, reapply every 2 hours as needed.  - Call if any new or changing lesions are noted between office visits  History of Dysplastic Nevi -  No evidence of recurrence today multiple sites see patient history  - Recommend regular full body skin exams - Recommend daily broad spectrum sunscreen SPF 30+ to sun-exposed areas, reapply every 2 hours as needed.  - Call if any new or changing lesions are noted between office visits  Skin cancer screening performed  today. Return for 6 month tbse h/o bcc . IRuthell Rummage, CMA, am acting as scribe for Sarina Ser, MD. Documentation: I have reviewed the above documentation for accuracy and completeness, and I agree with the above.  Sarina Ser, MD

## 2021-12-13 ENCOUNTER — Encounter: Payer: Self-pay | Admitting: Dermatology

## 2022-01-03 ENCOUNTER — Ambulatory Visit: Payer: Medicare PPO | Admitting: Dermatology

## 2022-01-10 ENCOUNTER — Other Ambulatory Visit: Payer: Self-pay

## 2022-01-10 ENCOUNTER — Ambulatory Visit
Admission: RE | Admit: 2022-01-10 | Discharge: 2022-01-10 | Disposition: A | Discharge status: Home / Self Care | Payer: Medicare PPO | Source: Ambulatory Visit | Attending: Internal Medicine | Admitting: Internal Medicine

## 2022-01-10 DIAGNOSIS — Z1231 Encounter for screening mammogram for malignant neoplasm of breast: Secondary | ICD-10-CM | POA: Diagnosis not present

## 2022-06-12 ENCOUNTER — Ambulatory Visit: Payer: Medicare PPO | Admitting: Dermatology

## 2022-06-12 ENCOUNTER — Encounter: Payer: Self-pay | Admitting: Dermatology

## 2022-06-12 DIAGNOSIS — D239 Other benign neoplasm of skin, unspecified: Secondary | ICD-10-CM

## 2022-06-12 DIAGNOSIS — D229 Melanocytic nevi, unspecified: Secondary | ICD-10-CM | POA: Diagnosis not present

## 2022-06-12 DIAGNOSIS — Z86018 Personal history of other benign neoplasm: Secondary | ICD-10-CM

## 2022-06-12 DIAGNOSIS — Z85828 Personal history of other malignant neoplasm of skin: Secondary | ICD-10-CM | POA: Diagnosis not present

## 2022-06-12 DIAGNOSIS — L578 Other skin changes due to chronic exposure to nonionizing radiation: Secondary | ICD-10-CM

## 2022-06-12 DIAGNOSIS — Z1283 Encounter for screening for malignant neoplasm of skin: Secondary | ICD-10-CM | POA: Diagnosis not present

## 2022-06-12 DIAGNOSIS — L821 Other seborrheic keratosis: Secondary | ICD-10-CM

## 2022-06-12 DIAGNOSIS — L814 Other melanin hyperpigmentation: Secondary | ICD-10-CM

## 2022-06-12 DIAGNOSIS — C44311 Basal cell carcinoma of skin of nose: Secondary | ICD-10-CM

## 2022-06-12 DIAGNOSIS — D1800 Hemangioma unspecified site: Secondary | ICD-10-CM

## 2022-06-12 NOTE — Progress Notes (Signed)
   Follow-Up Visit   Subjective  Lisa Russell is a 66 y.o. female who presents for the following: Annual Exam (Here for skin cancer screening. Full body. Hx of BCC, HxDN). The patient presents for Total-Body Skin Exam (TBSE) for skin cancer screening and mole check.  The patient has spots, moles and lesions to be evaluated, some may be new or changing and the patient has concerns that these could be cancer.  The following portions of the chart were reviewed this encounter and updated as appropriate:  Tobacco  Allergies  Meds  Problems  Med Hx  Surg Hx  Fam Hx     Review of Systems: No other skin or systemic complaints except as noted in HPI or Assessment and Plan.  Objective  Well appearing patient in no apparent distress; mood and affect are within normal limits.  A full examination was performed including scalp, head, eyes, ears, nose, lips, neck, chest, axillae, abdomen, back, buttocks, bilateral upper extremities, bilateral lower extremities, hands, feet, fingers, toes, fingernails, and toenails. All findings within normal limits unless otherwise noted below.   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 Mohs. 05/2019. - No evidence of recurrence today - Recommend regular full body skin exams - Recommend daily broad spectrum sunscreen SPF 30+ to sun-exposed areas, reapply every 2 hours as needed.  - Call if any new or changing lesions are noted between office visits   History of Dysplastic Nevi. Left upper back, mod, 07/2019. Right inf scapula, severe, excised 03/23/2020. - No evidence of recurrence today - Recommend regular full body skin exams - Recommend daily broad spectrum sunscreen SPF 30+ to sun-exposed areas, reapply every 2 hours as needed.  - Call if any new or changing lesions are noted between office visits   Lentigines - Scattered tan macules - Due to sun exposure - Benign-appearing, observe - Recommend daily broad  spectrum sunscreen SPF 30+ to sun-exposed areas, reapply every 2 hours as needed. - Call for any changes  Seborrheic Keratoses - Stuck-on, waxy, tan-brown papules and/or plaques  - Benign-appearing - Discussed benign etiology and prognosis. - Observe - Call for any changes  Melanocytic Nevi - Tan-brown and/or pink-flesh-colored symmetric macules and papules - Benign appearing on exam today - Observation - Call clinic for new or changing moles - Recommend daily use of broad spectrum spf 30+ sunscreen to sun-exposed areas.   Hemangiomas - Red papules - Discussed benign nature - Observe - Call for any changes  Actinic Damage - Chronic condition, secondary to cumulative UV/sun exposure - diffuse scaly erythematous macules with underlying dyspigmentation - Recommend daily broad spectrum sunscreen SPF 30+ to sun-exposed areas, reapply every 2 hours as needed.  - Staying in the shade or wearing long sleeves, sun glasses (UVA+UVB protection) and wide brim hats (4-inch brim around the entire circumference of the hat) are also recommended for sun protection.  - Call for new or changing lesions.  Skin cancer screening performed today.  Return to clinic for ear piercing.   Return in about 6 months (around 12/13/2022) for TBSE, HxBCC, HxDN (severe).  I, Emelia Salisbury, CMA, am acting as scribe for Sarina Ser, MD. Documentation: I have reviewed the above documentation for accuracy and completeness, and I agree with the above.  Sarina Ser, MD

## 2022-06-12 NOTE — Patient Instructions (Signed)
Recommend daily broad spectrum sunscreen SPF 30+ to sun-exposed areas, reapply every 2 hours as needed. Call for new or changing lesions.  Staying in the shade or wearing long sleeves, sun glasses (UVA+UVB protection) and wide brim hats (4-inch brim around the entire circumference of the hat) are also recommended for sun protection.    Melanoma ABCDEs  Melanoma is the most dangerous type of skin cancer, and is the leading cause of death from skin disease.  You are more likely to develop melanoma if you: Have light-colored skin, light-colored eyes, or red or blond hair Spend a lot of time in the sun Tan regularly, either outdoors or in a tanning bed Have had blistering sunburns, especially during childhood Have a close family member who has had a melanoma Have atypical moles or large birthmarks  Early detection of melanoma is key since treatment is typically straightforward and cure rates are extremely high if we catch it early.   The first sign of melanoma is often a change in a mole or a new dark spot.  The ABCDE system is a way of remembering the signs of melanoma.  A for asymmetry:  The two halves do not match. B for border:  The edges of the growth are irregular. C for color:  A mixture of colors are present instead of an even brown color. D for diameter:  Melanomas are usually (but not always) greater than 6mm - the size of a pencil eraser. E for evolution:  The spot keeps changing in size, shape, and color.  Please check your skin once per month between visits. You can use a small mirror in front and a large mirror behind you to keep an eye on the back side or your body.   If you see any new or changing lesions before your next follow-up, please call to schedule a visit.  Please continue daily skin protection including broad spectrum sunscreen SPF 30+ to sun-exposed areas, reapplying every 2 hours as needed when you're outdoors.   Staying in the shade or wearing long sleeves, sun  glasses (UVA+UVB protection) and wide brim hats (4-inch brim around the entire circumference of the hat) are also recommended for sun protection.     Due to recent changes in healthcare laws, you may see results of your pathology and/or laboratory studies on MyChart before the doctors have had a chance to review them. We understand that in some cases there may be results that are confusing or concerning to you. Please understand that not all results are received at the same time and often the doctors may need to interpret multiple results in order to provide you with the best plan of care or course of treatment. Therefore, we ask that you please give us 2 business days to thoroughly review all your results before contacting the office for clarification. Should we see a critical lab result, you will be contacted sooner.   If You Need Anything After Your Visit  If you have any questions or concerns for your doctor, please call our main line at [phone] and press option 4 to reach your doctor's medical assistant. If no one answers, please leave a voicemail as directed and we will return your call as soon as possible. Messages left after 4 pm will be answered the following business day.   You may also send us a message via MyChart. We typically respond to MyChart messages within 1-2 business days.  For prescription refills, please ask your pharmacy to contact   our office. Our fax number is [phone].  If you have an urgent issue when the clinic is closed that cannot wait until the next business day, you can page your doctor at the number below.    Please note that while we do our best to be available for urgent issues outside of office hours, we are not available 24/7.   If you have an urgent issue and are unable to reach us, you may choose to seek medical care at your doctor's office, retail clinic, urgent care center, or emergency room.  If you have a medical emergency, please immediately  call 911 or go to the emergency department.  Pager Numbers  - Dr. Kowalski: [phone]  - Dr. Moye: [phone]  - Dr. Stewart: [phone]  In the event of inclement weather, please call our main line at [phone] for an update on the status of any delays or closures.  Dermatology Medication Tips: Please keep the boxes that topical medications come in in order to help keep track of the instructions about where and how to use these. Pharmacies typically print the medication instructions only on the boxes and not directly on the medication tubes.   If your medication is too expensive, please contact our office at [phone] option 4 or send us a message through MyChart.   We are unable to tell what your co-pay for medications will be in advance as this is different depending on your insurance coverage. However, we may be able to find a substitute medication at lower cost or fill out paperwork to get insurance to cover a needed medication.   If a prior authorization is required to get your medication covered by your insurance company, please allow us 1-2 business days to complete this process.  Drug prices often vary depending on where the prescription is filled and some pharmacies may offer cheaper prices.  The website www.goodrx.com contains coupons for medications through different pharmacies. The prices here do not account for what the cost may be with help from insurance (it may be cheaper with your insurance), but the website can give you the price if you did not use any insurance.  - You can print the associated coupon and take it with your prescription to the pharmacy.  - You may also stop by our office during regular business hours and pick up a GoodRx coupon card.  - If you need your prescription sent electronically to a different pharmacy, notify our office through [facility] MyChart or by phone at [phone] option 4.     Si Usted Necesita Algo Despus de Su  Visita  Tambin puede enviarnos un mensaje a travs de MyChart. Por lo general respondemos a los mensajes de MyChart en el transcurso de 1 a 2 das hbiles.  Para renovar recetas, por favor pida a su farmacia que se ponga en contacto con nuestra oficina. Nuestro nmero de fax es el [phone].  Si tiene un asunto urgente cuando la clnica est cerrada y que no puede esperar hasta el siguiente da hbil, puede llamar/localizar a su doctor(a) al nmero que aparece a continuacin.   Por favor, tenga en cuenta que aunque hacemos todo lo posible para estar disponibles para asuntos urgentes fuera del horario de oficina, no estamos disponibles las 24 horas del da, los 7 das de la semana.   Si tiene un problema urgente y no puede comunicarse con nosotros, puede optar por buscar atencin mdica  en el consultorio de su doctor(a), en una clnica privada,   en un centro de atencin urgente o en una sala de emergencias.  Si tiene una emergencia mdica, por favor llame inmediatamente al 911 o vaya a la sala de emergencias.  Nmeros de bper  - Dr. Kowalski: [phone]  - Dra. Moye: [phone]  - Dra. Stewart: [phone]  En caso de inclemencias del tiempo, por favor llame a nuestra lnea principal al [phone] para una actualizacin sobre el estado de cualquier retraso o cierre.  Consejos para la medicacin en dermatologa: Por favor, guarde las cajas en las que vienen los medicamentos de uso tpico para ayudarle a seguir las instrucciones sobre dnde y cmo usarlos. Las farmacias generalmente imprimen las instrucciones del medicamento slo en las cajas y no directamente en los tubos del medicamento.   Si su medicamento es muy caro, por favor, pngase en contacto con nuestra oficina llamando al [phone] y presione la opcin 4 o envenos un mensaje a travs de MyChart.   No podemos decirle cul ser su copago por los medicamentos por adelantado ya que esto es diferente dependiendo de la  cobertura de su seguro. Sin embargo, es posible que podamos encontrar un medicamento sustituto a menor costo o llenar un formulario para que el seguro cubra el medicamento que se considera necesario.   Si se requiere una autorizacin previa para que su compaa de seguros cubra su medicamento, por favor permtanos de 1 a 2 das hbiles para completar este proceso.  Los precios de los medicamentos varan con frecuencia dependiendo del lugar de dnde se surte la receta y alguna farmacias pueden ofrecer precios ms baratos.  El sitio web www.goodrx.com tiene cupones para medicamentos de diferentes farmacias. Los precios aqu no tienen en cuenta lo que podra costar con la ayuda del seguro (puede ser ms barato con su seguro), pero el sitio web puede darle el precio si no utiliz ningn seguro.  - Puede imprimir el cupn correspondiente y llevarlo con su receta a la farmacia.  - Tambin puede pasar por nuestra oficina durante el horario de atencin regular y recoger una tarjeta de cupones de GoodRx.  - Si necesita que su receta se enve electrnicamente a una farmacia diferente, informe a nuestra oficina a travs de MyChart de [place] o por telfono llamando al [phone] y presione la opcin 4.  

## 2022-06-13 ENCOUNTER — Ambulatory Visit (INDEPENDENT_AMBULATORY_CARE_PROVIDER_SITE_OTHER): Payer: Self-pay | Admitting: Dermatology

## 2022-06-13 ENCOUNTER — Encounter: Payer: Self-pay | Admitting: Dermatology

## 2022-06-13 DIAGNOSIS — Z413 Encounter for ear piercing: Secondary | ICD-10-CM

## 2022-06-13 NOTE — Patient Instructions (Addendum)
Advised patient to turn earrings once or twice daily. Clean daily with alcohol. Do not change earrings for at least 6 weeks.    Due to recent changes in healthcare laws, you may see results of your pathology and/or laboratory studies on MyChart before the doctors have had a chance to review them. We understand that in some cases there may be results that are confusing or concerning to you. Please understand that not all results are received at the same time and often the doctors may need to interpret multiple results in order to provide you with the best plan of care or course of treatment. Therefore, we ask that you please give [place] 2 business days to thoroughly review all your results before contacting the office for clarification. Should we see a critical lab result, you will be contacted sooner.   If You Need Anything After Your Visit  If you have any questions or concerns for your doctor, please call our main line at [phone] and press option 4 to reach your doctor's medical assistant. If no one answers, please leave a voicemail as directed and we will return your call as soon as possible. Messages left after 4 pm will be answered the following business day.   You may also send [place] a message via [place]. We typically respond to MyChart messages within 1-2 business days.  For prescription refills, please ask your pharmacy to contact our office. Our fax number is [phone].  If you have an urgent issue when the clinic is closed that cannot wait until the next business day, you can page your doctor at the number below.    Please note that while we do our best to be available for urgent issues outside of office hours, we are not available 24/7.   If you have an urgent issue and are unable to reach [place], you may choose to seek medical care at your doctor's office, retail clinic, urgent care center, or emergency room.  If you have a medical emergency, please immediately call 911 or go to the  emergency department.  Pager Numbers  - Dr. Nehemiah Massed: [phone]  - Dr. Laurence Ferrari: [phone]  - Dr. Nicole Kindred: [phone]  In the event of inclement weather, please call our main line at [phone] for an update on the status of any delays or closures.  Dermatology Medication Tips: Please keep the boxes that topical medications come in in order to help keep track of the instructions about where and how to use these. Pharmacies typically print the medication instructions only on the boxes and not directly on the medication tubes.   If your medication is too expensive, please contact our office at [phone] option 4 or send [place] a message through [place].   We are unable to tell what your co-pay for medications will be in advance as this is different depending on your insurance coverage. However, we may be able to find a substitute medication at lower cost or fill out paperwork to get insurance to cover a needed medication.   If a prior authorization is required to get your medication covered by your insurance company, please allow [place] 1-2 business days to complete this process.  Drug prices often vary depending on where the prescription is filled and some pharmacies may offer cheaper prices.  The website www.goodrx.com contains coupons for medications through different pharmacies. The prices here do not account for what the cost may be with help from insurance (it may be cheaper with your insurance), but the  website can give you the price if you did not use any insurance.  - You can print the associated coupon and take it with your prescription to the pharmacy.  - You may also stop by our office during regular business hours and pick up a GoodRx coupon card.  - If you need your prescription sent electronically to a different pharmacy, notify our office through [facility] or by phone at [phone] option 4.     Si Usted Necesita Algo Despus de Su Visita  Tambin puede  enviarnos un mensaje a travs de Pharmacist, community. Por lo general respondemos a los mensajes de MyChart en el transcurso de 1 a 2 das hbiles.  Para renovar recetas, por favor pida a su farmacia que se ponga en contacto con nuestra oficina. Harland Dingwall de fax es [place] [phone].  Si tiene un asunto urgente cuando la clnica est cerrada y que no puede esperar hasta el siguiente da hbil, puede llamar/localizar a su doctor(a) al nmero que aparece a continuacin.   Por favor, tenga en cuenta que aunque hacemos todo lo posible para estar disponibles para asuntos urgentes fuera del horario de [place], no estamos disponibles las 24 horas del da, los 7 das de la [place].   Si tiene un problema urgente y no puede comunicarse con nosotros, puede optar por buscar atencin mdica  en el consultorio de su doctor(a), en una clnica privada, en un centro de atencin urgente o en una sala de emergencias.  Si tiene Engineering geologist, por favor llame inmediatamente al 911 o vaya a la sala de emergencias.  Nmeros de bper  - Dr. Nehemiah Massed: [phone]  - Dra. Moye: [phone]  - Dra. Nicole Kindred: [phone]  En caso de inclemencias del [place], por favor llame a Johnsie Kindred principal al [phone] para una actualizacin sobre el [place] de cualquier retraso o cierre.  Consejos para la medicacin en dermatologa: Por favor, guarde las cajas en las que vienen los medicamentos de uso tpico para ayudarle a seguir las instrucciones sobre dnde y cmo usarlos. Las farmacias generalmente imprimen las instrucciones del medicamento slo en las cajas y no directamente en los tubos del [place].   Si su medicamento es muy caro, por favor, pngase en contacto con Zigmund Daniel llamando al [phone] y presione la opcin 4 o envenos un mensaje a travs de Pharmacist, community.   No podemos decirle cul ser su copago por los medicamentos por adelantado ya que esto es diferente dependiendo de la cobertura de su seguro.  Sin embargo, es posible que podamos encontrar un medicamento sustituto a Electrical engineer un formulario para que el seguro cubra el medicamento que se considera necesario.   Si se requiere una autorizacin previa para que su compaa de seguros [place] su medicamento, por favor permtanos de 1 a 2 das hbiles para completar este proceso.  Los precios de los medicamentos varan con frecuencia dependiendo del Environmental consultant de dnde se surte la receta y alguna farmacias pueden ofrecer precios ms baratos.  El sitio web www.goodrx.com tiene cupones para medicamentos de Airline pilot. Los precios aqu no tienen en cuenta lo que podra costar con la ayuda del seguro (puede ser ms barato con su seguro), pero el sitio web puede darle el precio si no utiliz Research scientist (physical sciences).  - Puede imprimir el cupn correspondiente y llevarlo con su receta a la farmacia.  - Tambin puede pasar por nuestra oficina durante el horario de atencin regular y Charity fundraiser una tarjeta de cupones de GoodRx.  -  Si necesita que su receta se enve electrnicamente a una farmacia diferente, informe a nuestra oficina a travs de MyChart de [place] o por telfono llamando al [phone] y presione la opcin 4.  

## 2022-06-13 NOTE — Progress Notes (Signed)
   Follow-Up Visit   Subjective  Lisa Russell is a 66 y.o. female who presents for the following: Other (Ear piercing today).  The following portions of the chart were reviewed this encounter and updated as appropriate:   Tobacco  Allergies  Meds  Problems  Med Hx  Surg Hx  Fam Hx     Review of Systems:  No other skin or systemic complaints except as noted in HPI or Assessment and Plan.  Objective  Well appearing patient in no apparent distress; mood and affect are within normal limits.  A focused examination was performed including bilateral earlobes. Relevant physical exam findings are noted in the Assessment and Plan.   Assessment & Plan  Encounter for ear piercing Bilateral earlobes  Bilateral earlobes cleaned with Puracyn then with alcohol. Earlobes marked and aprroved by Dr Nehemiah Massed and by patient. Ears pierced today. Advised patient to turn earrings once or twice daily. Clean daily with alcohol. Do not change earrings for at least 6 weeks.   Return for Follow up as scheduled.  I, Ashok Cordia, CMA, am acting as scribe for Lisa Ser, MD . Documentation: I have reviewed the above documentation for accuracy and completeness, and I agree with the above.  Lisa Ser, MD

## 2022-07-11 ENCOUNTER — Other Ambulatory Visit: Payer: Self-pay | Admitting: Internal Medicine

## 2022-07-11 DIAGNOSIS — E7800 Pure hypercholesterolemia, unspecified: Secondary | ICD-10-CM

## 2022-08-01 IMAGING — MG MM DIGITAL SCREENING BILAT W/ TOMO AND CAD
8 series · 8 of 24 positions shown · non-contrast
Comparison: Previous exam(s).

CLINICAL DATA: Screening.

EXAM:
DIGITAL SCREENING BILATERAL MAMMOGRAM WITH TOMOSYNTHESIS AND CAD
TECHNIQUE: Bilateral screening digital craniocaudal and mediolateral oblique
mammograms were obtained. Bilateral screening digital breast
tomosynthesis was performed. The images were evaluated with
computer-aided detection.

[R CC synth-2D]
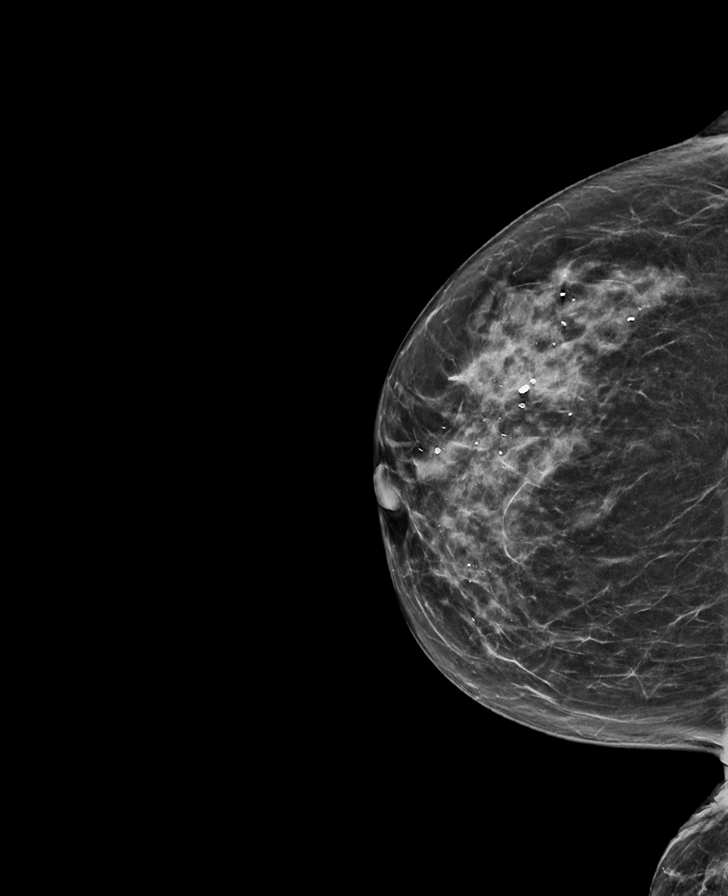

[R MLO synth-2D]
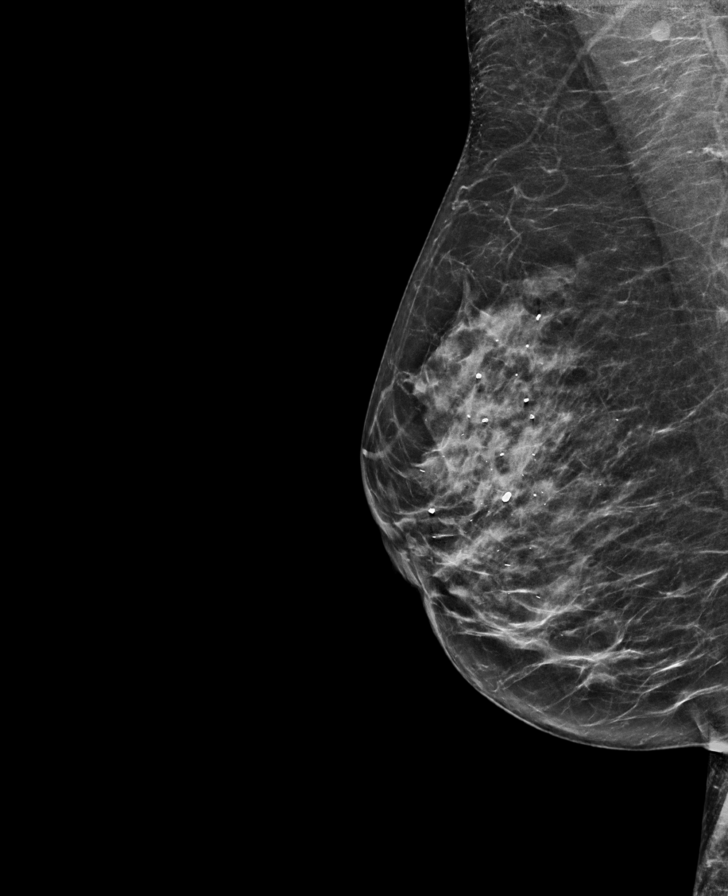

[L MLO synth-2D]
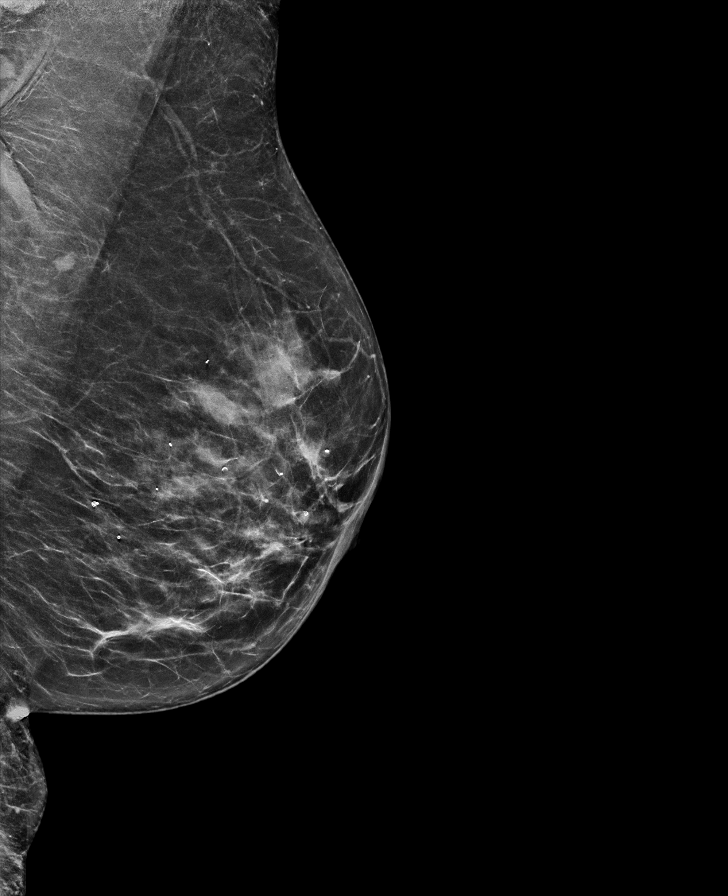

[L CC synth-2D]
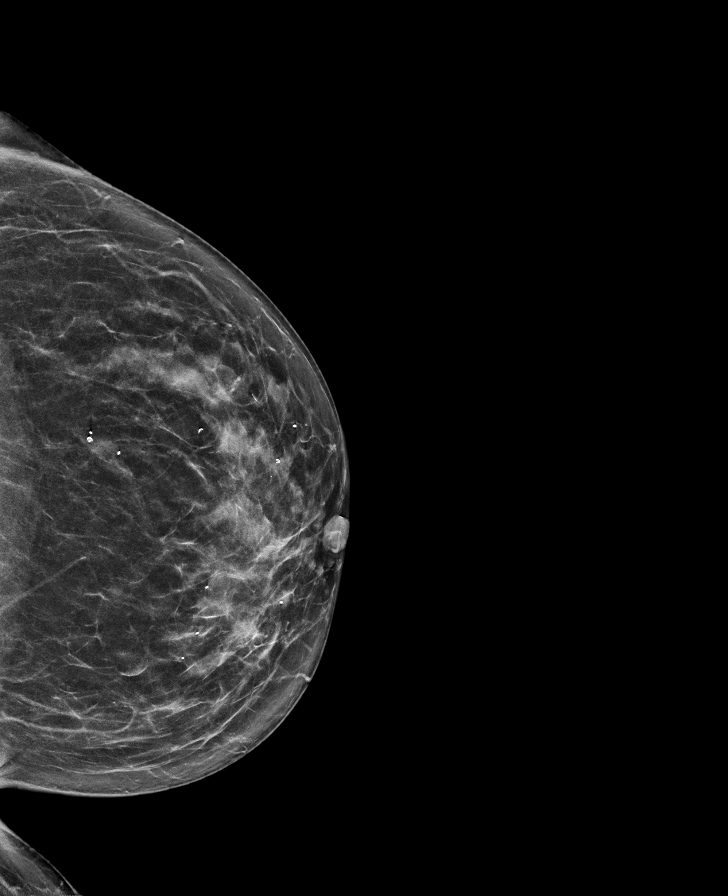

[R CC tomo · tomo slice 33/65.0]
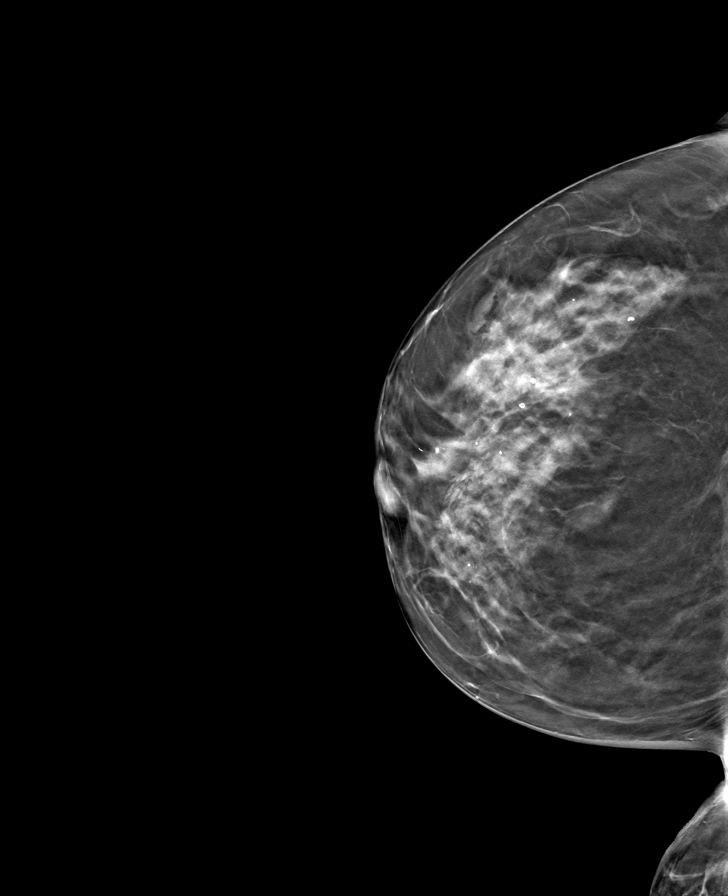

[L CC tomo · tomo slice 33/64.0]
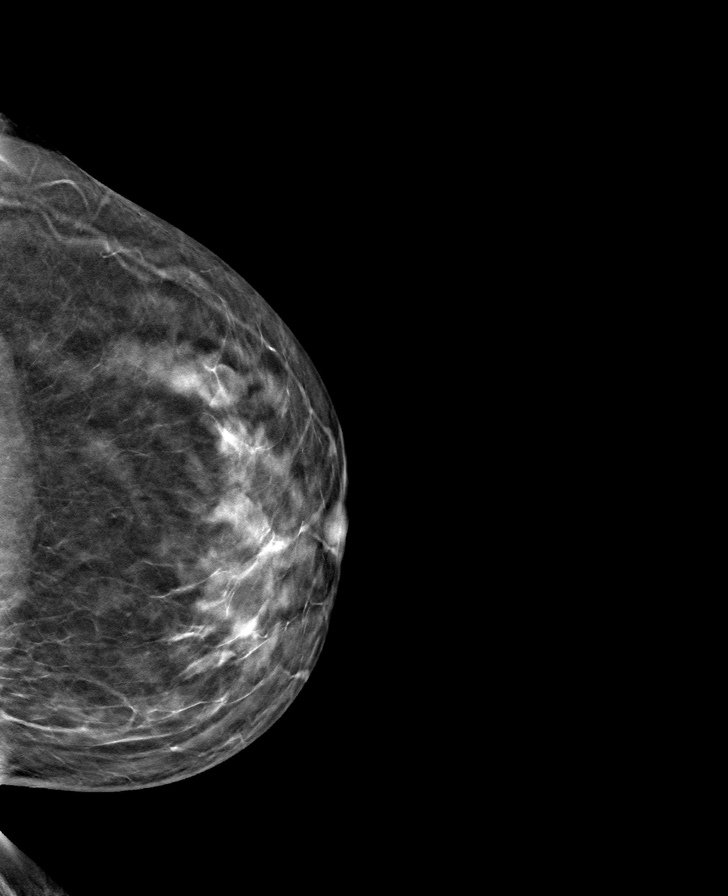

[R MLO tomo · tomo slice 33/64.0]
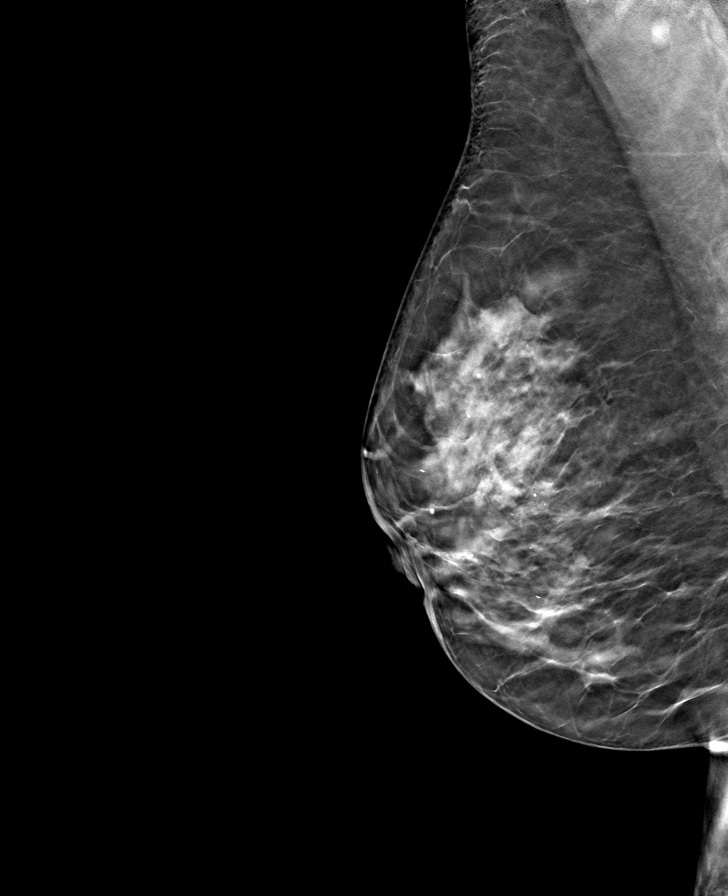

[L MLO tomo · tomo slice 33/66.0]
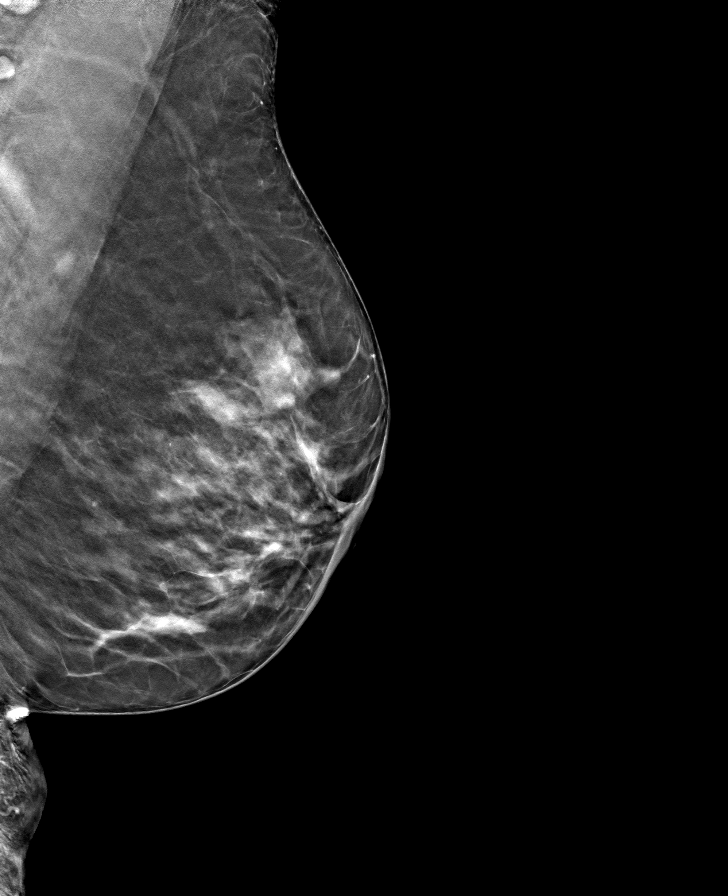

[8 of 24 positions shown; findings below may reference images not displayed]

ACR Breast Density Category b: There are scattered areas of
fibroglandular density.
FINDINGS: There are no findings suspicious for malignancy.
IMPRESSION: No mammographic evidence of malignancy. A result letter of this
screening mammogram will be mailed directly to the patient.

RECOMMENDATION:
Screening mammogram in one year. (Code:51-O-LD2)

BI-RADS CATEGORY  1: Negative.

## 2022-11-07 ENCOUNTER — Other Ambulatory Visit: Payer: Self-pay | Admitting: Internal Medicine

## 2022-11-07 DIAGNOSIS — Z1231 Encounter for screening mammogram for malignant neoplasm of breast: Secondary | ICD-10-CM

## 2022-12-11 ENCOUNTER — Ambulatory Visit: Payer: Medicare PPO | Admitting: Dermatology

## 2022-12-11 ENCOUNTER — Encounter: Payer: Self-pay | Admitting: Dermatology

## 2022-12-11 VITALS — BP 142/77 | HR 60

## 2022-12-11 DIAGNOSIS — D229 Melanocytic nevi, unspecified: Secondary | ICD-10-CM | POA: Diagnosis not present

## 2022-12-11 DIAGNOSIS — D1801 Hemangioma of skin and subcutaneous tissue: Secondary | ICD-10-CM

## 2022-12-11 DIAGNOSIS — Z85828 Personal history of other malignant neoplasm of skin: Secondary | ICD-10-CM

## 2022-12-11 DIAGNOSIS — L570 Actinic keratosis: Secondary | ICD-10-CM | POA: Diagnosis not present

## 2022-12-11 DIAGNOSIS — L578 Other skin changes due to chronic exposure to nonionizing radiation: Secondary | ICD-10-CM | POA: Diagnosis not present

## 2022-12-11 DIAGNOSIS — L814 Other melanin hyperpigmentation: Secondary | ICD-10-CM | POA: Diagnosis not present

## 2022-12-11 DIAGNOSIS — L821 Other seborrheic keratosis: Secondary | ICD-10-CM

## 2022-12-11 DIAGNOSIS — Z86018 Personal history of other benign neoplasm: Secondary | ICD-10-CM

## 2022-12-11 DIAGNOSIS — Z1283 Encounter for screening for malignant neoplasm of skin: Secondary | ICD-10-CM | POA: Diagnosis not present

## 2022-12-11 NOTE — Patient Instructions (Signed)
Due to recent changes in healthcare laws, you may see results of your pathology and/or laboratory studies on MyChart before the doctors have had a chance to review them. We understand that in some cases there may be results that are confusing or concerning to you. Please understand that not all results are received at the same time and often the doctors may need to interpret multiple results in order to provide you with the best plan of care or course of treatment. Therefore, we ask that you please give us 2 business days to thoroughly review all your results before contacting the office for clarification. Should we see a critical lab result, you will be contacted sooner.   If You Need Anything After Your Visit  If you have any questions or concerns for your doctor, please call our main line at [phone] and press option 4 to reach your doctor's medical assistant. If no one answers, please leave a voicemail as directed and we will return your call as soon as possible. Messages left after 4 pm will be answered the following business day.   You may also send us a message via MyChart. We typically respond to MyChart messages within 1-2 business days.  For prescription refills, please ask your pharmacy to contact our office. Our fax number is [phone].  If you have an urgent issue when the clinic is closed that cannot wait until the next business day, you can page your doctor at the number below.    Please note that while we do our best to be available for urgent issues outside of office hours, we are not available 24/7.   If you have an urgent issue and are unable to reach us, you may choose to seek medical care at your doctor's office, retail clinic, urgent care center, or emergency room.  If you have a medical emergency, please immediately call 911 or go to the emergency department.  Pager Numbers  - Dr. Kowalski: [phone]  - Dr. Moye: [phone]  - Dr. Stewart:  [phone]  In the event of inclement weather, please call our main line at [phone] for an update on the status of any delays or closures.  Dermatology Medication Tips: Please keep the boxes that topical medications come in in order to help keep track of the instructions about where and how to use these. Pharmacies typically print the medication instructions only on the boxes and not directly on the medication tubes.   If your medication is too expensive, please contact our office at [phone] option 4 or send us a message through MyChart.   We are unable to tell what your co-pay for medications will be in advance as this is different depending on your insurance coverage. However, we may be able to find a substitute medication at lower cost or fill out paperwork to get insurance to cover a needed medication.   If a prior authorization is required to get your medication covered by your insurance company, please allow us 1-2 business days to complete this process.  Drug prices often vary depending on where the prescription is filled and some pharmacies may offer cheaper prices.  The website www.goodrx.com contains coupons for medications through different pharmacies. The prices here do not account for what the cost may be with help from insurance (it may be cheaper with your insurance), but the website can give you the price if you did not use any insurance.  - You can print the associated coupon and take it with   your prescription to the pharmacy.  - You may also stop by our office during regular business hours and pick up a GoodRx coupon card.  - If you need your prescription sent electronically to a different pharmacy, notify our office through [facility] MyChart or by phone at [phone] option 4.     Si Usted Necesita Algo Despus de Su Visita  Tambin puede enviarnos un mensaje a travs de MyChart. Por lo general respondemos a los mensajes de MyChart en el transcurso de 1 a 2  das hbiles.  Para renovar recetas, por favor pida a su farmacia que se ponga en contacto con nuestra oficina. Nuestro nmero de fax es el [phone].  Si tiene un asunto urgente cuando la clnica est cerrada y que no puede esperar hasta el siguiente da hbil, puede llamar/localizar a su doctor(a) al nmero que aparece a continuacin.   Por favor, tenga en cuenta que aunque hacemos todo lo posible para estar disponibles para asuntos urgentes fuera del horario de oficina, no estamos disponibles las 24 horas del da, los 7 das de la semana.   Si tiene un problema urgente y no puede comunicarse con nosotros, puede optar por buscar atencin mdica  en el consultorio de su doctor(a), en una clnica privada, en un centro de atencin urgente o en una sala de emergencias.  Si tiene una emergencia mdica, por favor llame inmediatamente al 911 o vaya a la sala de emergencias.  Nmeros de bper  - Dr. Kowalski: [phone]  - Dra. Moye: [phone]  - Dra. Stewart: [phone]  En caso de inclemencias del tiempo, por favor llame a nuestra lnea principal al [phone] para una actualizacin sobre el estado de cualquier retraso o cierre.  Consejos para la medicacin en dermatologa: Por favor, guarde las cajas en las que vienen los medicamentos de uso tpico para ayudarle a seguir las instrucciones sobre dnde y cmo usarlos. Las farmacias generalmente imprimen las instrucciones del medicamento slo en las cajas y no directamente en los tubos del medicamento.   Si su medicamento es muy caro, por favor, pngase en contacto con nuestra oficina llamando al [phone] y presione la opcin 4 o envenos un mensaje a travs de MyChart.   No podemos decirle cul ser su copago por los medicamentos por adelantado ya que esto es diferente dependiendo de la cobertura de su seguro. Sin embargo, es posible que podamos encontrar un medicamento sustituto a menor costo o llenar un formulario para que el  seguro cubra el medicamento que se considera necesario.   Si se requiere una autorizacin previa para que su compaa de seguros cubra su medicamento, por favor permtanos de 1 a 2 das hbiles para completar este proceso.  Los precios de los medicamentos varan con frecuencia dependiendo del lugar de dnde se surte la receta y alguna farmacias pueden ofrecer precios ms baratos.  El sitio web www.goodrx.com tiene cupones para medicamentos de diferentes farmacias. Los precios aqu no tienen en cuenta lo que podra costar con la ayuda del seguro (puede ser ms barato con su seguro), pero el sitio web puede darle el precio si no utiliz ningn seguro.  - Puede imprimir el cupn correspondiente y llevarlo con su receta a la farmacia.  - Tambin puede pasar por nuestra oficina durante el horario de atencin regular y recoger una tarjeta de cupones de GoodRx.  - Si necesita que su receta se enve electrnicamente a una farmacia diferente, informe a nuestra oficina a travs de MyChart de [place]   o por telfono llamando al [phone] y presione la opcin 4.  

## 2022-12-11 NOTE — Progress Notes (Signed)
Follow-Up Visit   Subjective  Lisa Russell is a 67 y.o. female who presents for the following: Annual Exam (Hx BCC, dysplastic nevi ). The patient presents for Total-Body Skin Exam (TBSE) for skin cancer screening and mole check.  The patient has spots, moles and lesions to be evaluated, some may be new or changing.  The following portions of the chart were reviewed this encounter and updated as appropriate:   Tobacco  Allergies  Meds  Problems  Med Hx  Surg Hx  Fam Hx     Review of Systems:  No other skin or systemic complaints except as noted in HPI or Assessment and Plan.  Objective  Well appearing patient in no apparent distress; mood and affect are within normal limits.  A full examination was performed including scalp, head, eyes, ears, nose, lips, neck, chest, axillae, abdomen, back, buttocks, bilateral upper extremities, bilateral lower extremities, hands, feet, fingers, toes, fingernails, and toenails. All findings within normal limits unless otherwise noted below.  R med canthus/R lower eyelid margin x 1 Erythematous thin papules/macules with gritty scale.    Assessment & Plan  AK (actinic keratosis) R med canthus/R lower eyelid margin x 1  Destruction of lesion - R med canthus/R lower eyelid margin x 1 Complexity: simple   Destruction method: cryotherapy   Informed consent: discussed and consent obtained   Timeout:  patient name, date of birth, surgical site, and procedure verified Lesion destroyed using liquid nitrogen: Yes   Region frozen until ice ball extended beyond lesion: Yes   Outcome: patient tolerated procedure well with no complications   Post-procedure details: wound care instructions given    Lentigines - Scattered tan macules - Due to sun exposure - Benign-appearing, observe - Recommend daily broad spectrum sunscreen SPF 30+ to sun-exposed areas, reapply every 2 hours as needed. - Call for any changes  Seborrheic Keratoses - Stuck-on,  waxy, tan-brown papules and/or plaques  - Benign-appearing - Discussed benign etiology and prognosis. - Observe - Call for any changes  Melanocytic Nevi - Tan-brown and/or pink-flesh-colored symmetric macules and papules - Benign appearing on exam today - Observation - Call clinic for new or changing moles - Recommend daily use of broad spectrum spf 30+ sunscreen to sun-exposed areas.   Hemangiomas - Red papules - Discussed benign nature - Observe - Call for any changes  Actinic Damage - Chronic condition, secondary to cumulative UV/sun exposure - diffuse scaly erythematous macules with underlying dyspigmentation - Recommend daily broad spectrum sunscreen SPF 30+ to sun-exposed areas, reapply every 2 hours as needed.  - Staying in the shade or wearing long sleeves, sun glasses (UVA+UVB protection) and wide brim hats (4-inch brim around the entire circumference of the hat) are also recommended for sun protection.  - Call for new or changing lesions.  History of Basal Cell Carcinoma of the Skin - No evidence of recurrence today - Recommend regular full body skin exams - Recommend daily broad spectrum sunscreen SPF 30+ to sun-exposed areas, reapply every 2 hours as needed.  - Call if any new or changing lesions are noted between office visits  History of Dysplastic Nevi - No evidence of recurrence today - Recommend regular full body skin exams - Recommend daily broad spectrum sunscreen SPF 30+ to sun-exposed areas, reapply every 2 hours as needed.  - Call if any new or changing lesions are noted between office visits  Skin cancer screening performed today.  Return in about 6 months (around 06/11/2023) for TBSE.  Luther Redo, CMA, am acting as scribe for Sarina Ser, MD . Documentation: I have reviewed the above documentation for accuracy and completeness, and I agree with the above.  Sarina Ser, MD

## 2022-12-15 ENCOUNTER — Encounter: Payer: Self-pay | Admitting: Dermatology

## 2023-01-11 ENCOUNTER — Ambulatory Visit
Admission: RE | Admit: 2023-01-11 | Discharge: 2023-01-11 | Disposition: A | Discharge status: Home / Self Care | Payer: Medicare PPO | Source: Ambulatory Visit | Attending: Internal Medicine | Admitting: Internal Medicine

## 2023-01-11 DIAGNOSIS — Z1231 Encounter for screening mammogram for malignant neoplasm of breast: Secondary | ICD-10-CM | POA: Diagnosis present

## 2023-05-09 ENCOUNTER — Encounter: Payer: Self-pay | Admitting: Dermatology

## 2023-05-09 ENCOUNTER — Ambulatory Visit: Payer: Medicare PPO | Admitting: Dermatology

## 2023-05-09 VITALS — BP 143/81 | HR 74

## 2023-05-09 DIAGNOSIS — Z1283 Encounter for screening for malignant neoplasm of skin: Secondary | ICD-10-CM | POA: Diagnosis not present

## 2023-05-09 DIAGNOSIS — Z85828 Personal history of other malignant neoplasm of skin: Secondary | ICD-10-CM

## 2023-05-09 DIAGNOSIS — L2089 Other atopic dermatitis: Secondary | ICD-10-CM

## 2023-05-09 DIAGNOSIS — Z86018 Personal history of other benign neoplasm: Secondary | ICD-10-CM

## 2023-05-09 DIAGNOSIS — L814 Other melanin hyperpigmentation: Secondary | ICD-10-CM | POA: Diagnosis not present

## 2023-05-09 DIAGNOSIS — L821 Other seborrheic keratosis: Secondary | ICD-10-CM

## 2023-05-09 DIAGNOSIS — D1801 Hemangioma of skin and subcutaneous tissue: Secondary | ICD-10-CM

## 2023-05-09 DIAGNOSIS — W908XXA Exposure to other nonionizing radiation, initial encounter: Secondary | ICD-10-CM

## 2023-05-09 DIAGNOSIS — L578 Other skin changes due to chronic exposure to nonionizing radiation: Secondary | ICD-10-CM

## 2023-05-09 DIAGNOSIS — L209 Atopic dermatitis, unspecified: Secondary | ICD-10-CM

## 2023-05-09 DIAGNOSIS — D229 Melanocytic nevi, unspecified: Secondary | ICD-10-CM

## 2023-05-09 DIAGNOSIS — Z79899 Other long term (current) drug therapy: Secondary | ICD-10-CM

## 2023-05-09 MED ORDER — MOMETASONE FUROATE 0.1 % EX CREA: TOPICAL_CREAM | CUTANEOUS | 0 refills | Status: DC

## 2023-05-09 NOTE — Patient Instructions (Addendum)
Treatment Plan: Start mometasone cream 1-2 times daily for up to 5 days a week. Avoid applying to face, groin, and axilla. Use as directed. Long-term use can cause thinning of the skin.  Topical steroids (such as triamcinolone, fluocinolone, fluocinonide, mometasone, clobetasol, halobetasol, betamethasone, hydrocortisone) can cause thinning and lightening of the skin if they are used for too long in the same area. Your physician has selected the right strength medicine for your problem and area affected on the body. Please use your medication only as directed by your physician to prevent side effects.   Recommend gentle skin care.  Melanoma ABCDEs  Melanoma is the most dangerous type of skin cancer, and is the leading cause of death from skin disease.  You are more likely to develop melanoma if you: Have light-colored skin, light-colored eyes, or red or blond hair Spend a lot of time in the sun Tan regularly, either outdoors or in a tanning bed Have had blistering sunburns, especially during childhood Have a close family member who has had a melanoma Have atypical moles or large birthmarks  Early detection of melanoma is key since treatment is typically straightforward and cure rates are extremely high if we catch it early.   The first sign of melanoma is often a change in a mole or a new dark spot.  The ABCDE system is a way of remembering the signs of melanoma.  A for asymmetry:  The two halves do not match. B for border:  The edges of the growth are irregular. C for color:  A mixture of colors are present instead of an even brown color. D for diameter:  Melanomas are usually (but not always) greater than 6mm - the size of a pencil eraser. E for evolution:  The spot keeps changing in size, shape, and color.  Please check your skin once per month between visits. You can use a small mirror in front and a large mirror behind you to keep an eye on the back side or your body.   If you see  any new or changing lesions before your next follow-up, please call to schedule a visit.  Please continue daily skin protection including broad spectrum sunscreen SPF 30+ to sun-exposed areas, reapplying every 2 hours as needed when you're outdoors.    Due to recent changes in healthcare laws, you may see results of your pathology and/or laboratory studies on MyChart before the doctors have had a chance to review them. We understand that in some cases there may be results that are confusing or concerning to you. Please understand that not all results are received at the same time and often the doctors may need to interpret multiple results in order to provide you with the best plan of care or course of treatment. Therefore, we ask that you please give [place] 2 business days to thoroughly review all your results before contacting the office for clarification. Should we see a critical lab result, you will be contacted sooner.   If You Need Anything After Your Visit  If you have any questions or concerns for your doctor, please call our main line at [phone] and press option 4 to reach your doctor's medical assistant. If no one answers, please leave a voicemail as directed and we will return your call as soon as possible. Messages left after 4 pm will be answered the following business day.   You may also send [place] a message via MyChart. We typically respond to MyChart messages within 1-2  business days.  For prescription refills, please ask your pharmacy to contact our office. Our fax number is [phone].  If you have an urgent issue when the clinic is closed that cannot wait until the next business day, you can page your doctor at the number below.    Please note that while we do our best to be available for urgent issues outside of office hours, we are not available 24/7.   If you have an urgent issue and are unable to reach [place], you may choose to seek medical care at your doctor's office, retail  clinic, urgent care center, or emergency room.  If you have a medical emergency, please immediately call 911 or go to the emergency department.  Pager Numbers  - Dr. Gwen Pounds: [phone]  - Dr. Neale Burly: [phone]  - Dr. Roseanne Reno: [phone]  In the event of inclement weather, please call our main line at [phone] for an update on the status of any delays or closures.  Dermatology Medication Tips: Please keep the boxes that topical medications come in in order to help keep track of the instructions about where and how to use these. Pharmacies typically print the medication instructions only on the boxes and not directly on the medication tubes.   If your medication is too expensive, please contact our office at [phone] option 4 or send [place] a message through MyChart.   We are unable to tell what your co-pay for medications will be in advance as this is different depending on your insurance coverage. However, we may be able to find a substitute medication at lower cost or fill out paperwork to get insurance to cover a needed medication.   If a prior authorization is required to get your medication covered by your insurance company, please allow [place] 1-2 business days to complete this process.  Drug prices often vary depending on where the prescription is filled and some pharmacies may offer cheaper prices.  The website www.goodrx.com contains coupons for medications through different pharmacies. The prices here do not account for what the cost may be with help from insurance (it may be cheaper with your insurance), but the website can give you the price if you did not use any insurance.  - You can print the associated coupon and take it with your prescription to the pharmacy.  - You may also stop by our office during regular business hours and pick up a GoodRx coupon card.  - If you need your prescription sent electronically to a different pharmacy, notify our office through [facility] or by phone at [phone] option 4.

## 2023-05-09 NOTE — Progress Notes (Signed)
Follow-Up Visit   Subjective  Lisa Russell is a 67 y.o. female who presents for the following: Skin Cancer Screening and Full Body Skin Exam  The patient presents for Total-Body Skin Exam (TBSE) for skin cancer screening and mole check. The patient has spots, moles and lesions to be evaluated, some may be new or changing and the patient has concerns that these could be cancer.  Hx BCC, dysplastic nevi. Patient does have some roughness at right 5th finger. Present for a few months with no itching.   The following portions of the chart were reviewed this encounter and updated as appropriate: medications, allergies, medical history  Review of Systems:  No other skin or systemic complaints except as noted in HPI or Assessment and Plan.  Objective  Well appearing patient in no apparent distress; mood and affect are within normal limits.  A full examination was performed including scalp, head, eyes, ears, nose, lips, neck, chest, axillae, abdomen, back, buttocks, bilateral upper extremities, bilateral lower extremities, hands, feet, fingers, toes, fingernails, and toenails. All findings within normal limits unless otherwise noted below.   Relevant physical exam findings are noted in the Assessment and Plan.    Assessment & Plan   LENTIGINES, SEBORRHEIC KERATOSES, HEMANGIOMAS - Benign normal skin lesions - Benign-appearing - Call for any changes  MELANOCYTIC NEVI - Tan-brown and/or pink-flesh-colored symmetric macules and papules - Benign appearing on exam today - Observation - Call clinic for new or changing moles - Recommend daily use of broad spectrum spf 30+ sunscreen to sun-exposed areas.   ACTINIC DAMAGE - Chronic condition, secondary to cumulative UV/sun exposure - diffuse scaly erythematous macules with underlying dyspigmentation - Recommend daily broad spectrum sunscreen SPF 30+ to sun-exposed areas, reapply every 2 hours as needed.  - Staying in the shade or wearing  long sleeves, sun glasses (UVA+UVB protection) and wide brim hats (4-inch brim around the entire circumference of the hat) are also recommended for sun protection.  - Call for new or changing lesions.  SKIN CANCER SCREENING PERFORMED TODAY.  HISTORY OF BASAL CELL CARCINOMA OF THE SKIN - No evidence of recurrence today at left paranasal, Mohs Dr. Adriana Simas - Recommend regular full body skin exams - Recommend daily broad spectrum sunscreen SPF 30+ to sun-exposed areas, reapply every 2 hours as needed.  - Call if any new or changing lesions are noted between office visits  History of Dysplastic Nevi - No evidence of recurrence today - Recommend regular full body skin exams - Recommend daily broad spectrum sunscreen SPF 30+ to sun-exposed areas, reapply every 2 hours as needed.  - Call if any new or changing lesions are noted between office visits  ATOPIC DERMATITIS Exam: Scaliness and lichenification at right 5th finger   Chronic and persistent condition with duration or expected duration over one year. Condition is symptomatic/ bothersome to patient. Not currently at goal.   Atopic dermatitis (eczema) is a chronic, relapsing, pruritic condition that can significantly affect quality of life. It is often associated with allergic rhinitis and/or asthma and can require treatment with topical medications, phototherapy, or in severe cases biologic injectable medication (Dupixent; Adbry) or Oral JAK inhibitors.  Treatment Plan: Start mometasone cream 1-2 times daily for up to 5 days a week. Avoid applying to face, groin, and axilla. Use as directed. Long-term use can cause thinning of the skin.  Topical steroids (such as triamcinolone, fluocinolone, fluocinonide, mometasone, clobetasol, halobetasol, betamethasone, hydrocortisone) can cause thinning and lightening of the skin if they  are used for too long in the same area. Your physician has selected the right strength medicine for your problem and  area affected on the body. Please use your medication only as directed by your physician to prevent side effects.    Recommend gentle skin care.   Return in about 6 months (around 11/08/2023) for TBSE, Hx Dysplastic Nevi, Hx BCC.  Anise Salvo, RMA, am acting as scribe for Armida Sans, MD .   Documentation: I have reviewed the above documentation for accuracy and completeness, and I agree with the above.  Armida Sans, MD

## 2023-05-11 ENCOUNTER — Encounter: Payer: Self-pay | Admitting: Dermatology

## 2023-11-21 ENCOUNTER — Ambulatory Visit: Payer: Medicare PPO | Admitting: Dermatology

## 2023-12-04 ENCOUNTER — Other Ambulatory Visit: Payer: Self-pay | Admitting: Internal Medicine

## 2023-12-04 DIAGNOSIS — Z1231 Encounter for screening mammogram for malignant neoplasm of breast: Secondary | ICD-10-CM

## 2023-12-06 ENCOUNTER — Ambulatory Visit: Payer: Medicare PPO | Admitting: Dermatology

## 2023-12-18 ENCOUNTER — Ambulatory Visit (INDEPENDENT_AMBULATORY_CARE_PROVIDER_SITE_OTHER): Payer: Medicare PPO | Admitting: Dermatology

## 2023-12-18 DIAGNOSIS — Z1283 Encounter for screening for malignant neoplasm of skin: Secondary | ICD-10-CM | POA: Diagnosis not present

## 2023-12-18 DIAGNOSIS — L814 Other melanin hyperpigmentation: Secondary | ICD-10-CM | POA: Diagnosis not present

## 2023-12-18 DIAGNOSIS — L578 Other skin changes due to chronic exposure to nonionizing radiation: Secondary | ICD-10-CM

## 2023-12-18 DIAGNOSIS — W908XXA Exposure to other nonionizing radiation, initial encounter: Secondary | ICD-10-CM

## 2023-12-18 DIAGNOSIS — Z86018 Personal history of other benign neoplasm: Secondary | ICD-10-CM

## 2023-12-18 DIAGNOSIS — Z85828 Personal history of other malignant neoplasm of skin: Secondary | ICD-10-CM

## 2023-12-18 DIAGNOSIS — D229 Melanocytic nevi, unspecified: Secondary | ICD-10-CM

## 2023-12-18 DIAGNOSIS — D225 Melanocytic nevi of trunk: Secondary | ICD-10-CM

## 2023-12-18 DIAGNOSIS — L821 Other seborrheic keratosis: Secondary | ICD-10-CM

## 2023-12-18 DIAGNOSIS — D1801 Hemangioma of skin and subcutaneous tissue: Secondary | ICD-10-CM

## 2023-12-18 NOTE — Progress Notes (Signed)
   Follow-Up Visit   Subjective  Lisa Russell is a 68 y.o. female who presents for the following: Skin Cancer Screening and Full Body Skin Exam  The patient presents for Total-Body Skin Exam (TBSE) for skin cancer screening and mole check. The patient has spots, moles and lesions to be evaluated, some may be new or changing. She has a couple of areas to check on right breast and left breast.  History of BCC of the left paranasal. History of dysplastic nevi.   The following portions of the chart were reviewed this encounter and updated as appropriate: medications, allergies, medical history  Review of Systems:  No other skin or systemic complaints except as noted in HPI or Assessment and Plan.  Objective  Well appearing patient in no apparent distress; mood and affect are within normal limits.  A full examination was performed including scalp, head, eyes, ears, nose, lips, neck, chest, axillae, abdomen, back, buttocks, bilateral upper extremities, bilateral lower extremities, hands, feet, fingers, toes, fingernails, and toenails. All findings within normal limits unless otherwise noted below.   Relevant physical exam findings are noted in the Assessment and Plan.    Assessment & Plan   SKIN CANCER SCREENING PERFORMED TODAY.  ACTINIC DAMAGE - Chronic condition, secondary to cumulative UV/sun exposure - diffuse scaly erythematous macules with underlying dyspigmentation - Recommend daily broad spectrum sunscreen SPF 30+ to sun-exposed areas, reapply every 2 hours as needed.  - Staying in the shade or wearing long sleeves, sun glasses (UVA+UVB protection) and wide brim hats (4-inch brim around the entire circumference of the hat) are also recommended for sun protection.  - Call for new or changing lesions.  LENTIGINES, HEMANGIOMAS (including left lat breast) - Benign normal skin lesions - Benign-appearing - Call for any changes  SEBORRHEIC KERATOSIS - Stuck-on, waxy, tan-brown  papules and/or plaques, including right lateral breast, left popliteal  - Benign-appearing - Discussed benign etiology and prognosis. - Observe - Call for any changes  MELANOCYTIC NEVI - Tan-brown and/or pink-flesh-colored symmetric macules and papules - 5.0 mm med dark brown macule left areola - 5.0 mm med brown macule central upper abdomen - Benign appearing on exam today - Observation - Call clinic for new or changing moles - Recommend daily use of broad spectrum spf 30+ sunscreen to sun-exposed areas.   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  - Call if any new or changing lesions are noted between office visits  HISTORY OF DYSPLASTIC NEVUS Right inf scapula, severe, exc 03/23/2020 Left upper back 3.5 cm lat to spine, mod, 2020 No evidence of recurrence today Recommend regular full body skin exams Recommend daily broad spectrum sunscreen SPF 30+ to sun-exposed areas, reapply every 2 hours as needed.  Call if any new or changing lesions are noted between office visits   Return in about 6 months (around 06/16/2024) for TBSE, Hx BCC, Hx AKs, Hx Dysplastic Nevus.  IAndrea Kerns, CMA, am acting as scribe for Rexene Rattler, MD .   Documentation: I have reviewed the above documentation for accuracy and completeness, and I agree with the above.  Rexene Rattler, MD

## 2023-12-18 NOTE — Patient Instructions (Signed)

## 2024-01-14 ENCOUNTER — Ambulatory Visit
Admission: RE | Admit: 2024-01-14 | Discharge: 2024-01-14 | Disposition: A | Discharge status: Home / Self Care | Payer: Medicare PPO | Source: Ambulatory Visit | Attending: Internal Medicine | Admitting: Internal Medicine

## 2024-01-14 DIAGNOSIS — Z1231 Encounter for screening mammogram for malignant neoplasm of breast: Secondary | ICD-10-CM | POA: Diagnosis present

## 2024-01-17 ENCOUNTER — Other Ambulatory Visit: Payer: Self-pay | Admitting: Internal Medicine

## 2024-01-17 DIAGNOSIS — R928 Other abnormal and inconclusive findings on diagnostic imaging of breast: Secondary | ICD-10-CM

## 2024-01-18 ENCOUNTER — Encounter: Payer: Self-pay | Admitting: Dermatology

## 2024-01-21 ENCOUNTER — Ambulatory Visit
Admission: RE | Admit: 2024-01-21 | Discharge: 2024-01-21 | Disposition: A | Discharge status: Home / Self Care | Source: Ambulatory Visit | Attending: Internal Medicine | Admitting: Internal Medicine

## 2024-01-21 ENCOUNTER — Other Ambulatory Visit: Payer: Self-pay

## 2024-01-21 DIAGNOSIS — R928 Other abnormal and inconclusive findings on diagnostic imaging of breast: Secondary | ICD-10-CM | POA: Diagnosis present

## 2024-01-21 MED ORDER — METRONIDAZOLE 0.75 % EX CREA: TOPICAL_CREAM | Freq: Two times a day (BID) | CUTANEOUS | 2 refills | Status: AC

## 2024-02-12 ENCOUNTER — Encounter: Payer: Self-pay | Admitting: Dermatology

## 2024-05-12 ENCOUNTER — Other Ambulatory Visit: Payer: Self-pay | Admitting: Internal Medicine

## 2024-05-12 ENCOUNTER — Ambulatory Visit
Admission: RE | Admit: 2024-05-12 | Discharge: 2024-05-12 | Disposition: A | Discharge status: Home / Self Care | Source: Ambulatory Visit | Attending: Internal Medicine | Admitting: Internal Medicine

## 2024-05-12 DIAGNOSIS — R519 Headache, unspecified: Secondary | ICD-10-CM | POA: Diagnosis present

## 2024-06-17 ENCOUNTER — Ambulatory Visit: Payer: Medicare PPO | Admitting: Dermatology

## 2024-06-19 ENCOUNTER — Ambulatory Visit: Admitting: Dermatology

## 2024-06-19 DIAGNOSIS — D229 Melanocytic nevi, unspecified: Secondary | ICD-10-CM

## 2024-06-19 DIAGNOSIS — L738 Other specified follicular disorders: Secondary | ICD-10-CM

## 2024-06-19 DIAGNOSIS — L814 Other melanin hyperpigmentation: Secondary | ICD-10-CM

## 2024-06-19 DIAGNOSIS — L719 Rosacea, unspecified: Secondary | ICD-10-CM | POA: Diagnosis not present

## 2024-06-19 DIAGNOSIS — D225 Melanocytic nevi of trunk: Secondary | ICD-10-CM

## 2024-06-19 DIAGNOSIS — L578 Other skin changes due to chronic exposure to nonionizing radiation: Secondary | ICD-10-CM | POA: Diagnosis not present

## 2024-06-19 DIAGNOSIS — Z872 Personal history of diseases of the skin and subcutaneous tissue: Secondary | ICD-10-CM

## 2024-06-19 DIAGNOSIS — Z1283 Encounter for screening for malignant neoplasm of skin: Secondary | ICD-10-CM | POA: Diagnosis not present

## 2024-06-19 DIAGNOSIS — Z85828 Personal history of other malignant neoplasm of skin: Secondary | ICD-10-CM

## 2024-06-19 DIAGNOSIS — L821 Other seborrheic keratosis: Secondary | ICD-10-CM

## 2024-06-19 NOTE — Patient Instructions (Addendum)
Rosacea  What is rosacea? Rosacea (say: ro-zay-sha) is a common skin disease that usually begins as a trend of flushing or blushing easily.  As rosacea progresses, a persistent redness in the center of the face will develop and may gradually spread beyond the nose and cheeks to the forehead and chin.  In some cases, the ears, chest, and back could be affected.  Rosacea may appear as tiny blood vessels or small red bumps that occur in crops.  Frequently they can contain pus, and are called "pustules".  If the bumps do not contain pus, they are referred to as "papules".  Rarely, in prolonged, untreated cases of rosacea, the oil glands of the nose and cheeks may become permanently enlarged.  This is called rhinophyma, and is seen more frequently in men.  Signs and Risks In its beginning stages, rosacea tends to come and go, which makes it difficult to recognize.  It can start as intermittent flushing of the face.  Eventually, blood vessels may become permanently visible.  Pustules and papules can appear, but can be mistaken for adult acne.  People of all races, ages, genders and ethnic groups are at risk of developing rosacea.  However, it is more common in women (especially around menopause) and adults with fair skin between the ages of 34 and 61.  Treatment Dermatologists typically recommend a combination of treatments to effectively manage rosacea.  Treatment can improve symptoms and may stop the progression of the rosacea.  Treatment may involve both topical and oral medications.  The tetracycline antibiotics are often used for their anti-inflammatory effect; however, because of the possibility of developing antibiotic resistance, they should not be used long term at full dose.  For dilated blood vessels the options include electrodessication (uses electric current through a small needle), laser treatment, and cosmetics to hide the redness.   With all forms of treatment, improvement is a slow process, and  patients may not see any results for the first 3-4 weeks.  It is very important to avoid the sun and other triggers.  Patients must wear sunscreen daily.  Skin Care Instructions: Cleanse the skin with a mild soap such as CeraVe cleanser, Cetaphil cleanser, or Dove soap once or twice daily as needed. Moisturize with Eucerin Redness Relief Daily Perfecting Lotion (has a subtle green tint), CeraVe Moisturizing Cream, or Oil of Olay Daily Moisturizer with sunscreen every morning and/or night as recommended. Makeup should be "non-comedogenic" (won't clog pores) and be labeled "for sensitive skin". Good choices for cosmetics are: Neutrogena, Almay, and Physician's Formula.  Any product with a green tint tends to offset a red complexion. If your eyes are dry and irritated, use artificial tears 2-3 times per day and cleanse the eyelids daily with baby shampoo.  Have your eyes examined at least every 2 years.  Be sure to tell your eye doctor that you have rosacea. Alcoholic beverages tend to cause flushing of the skin, and may make rosacea worse. Always wear sunscreen, protect your skin from extreme hot and cold temperatures, and avoid spicy foods, hot drinks, and mechanical irritation such as rubbing, scrubbing, or massaging the face.  Avoid harsh skin cleansers, cleansing masks, astringents, and exfoliation. If a particular product burns or makes your face feel tight, then it is likely to flare your rosacea. If you are having difficulty finding a sunscreen that you can tolerate, you may try switching to a chemical-free sunscreen.  These are ones whose active ingredient is zinc oxide or titanium dioxide  only.  They should also be fragrance free, non-comedogenic, and labeled for sensitive skin. Rosacea triggers may vary from person to person.  There are a variety of foods that have been reported to trigger rosacea.  Some patients find that keeping a diary of what they were doing when they flared helps them avoid  triggers.   Melanoma ABCDEs  Melanoma is the most dangerous type of skin cancer, and is the leading cause of death from skin disease.  You are more likely to develop melanoma if you: Have light-colored skin, light-colored eyes, or red or blond hair Spend a lot of time in the sun Tan regularly, either outdoors or in a tanning bed Have had blistering sunburns, especially during childhood Have a close family member who has had a melanoma Have atypical moles or large birthmarks  Early detection of melanoma is key since treatment is typically straightforward and cure rates are extremely high if we catch it early.   The first sign of melanoma is often a change in a mole or a new dark spot.  The ABCDE system is a way of remembering the signs of melanoma.  A for asymmetry:  The two halves do not match. B for border:  The edges of the growth are irregular. C for color:  A mixture of colors are present instead of an even brown color. D for diameter:  Melanomas are usually (but not always) greater than 6mm - the size of a pencil eraser. E for evolution:  The spot keeps changing in size, shape, and color.  Please check your skin once per month between visits. You can use a small mirror in front and a large mirror behind you to keep an eye on the back side or your body.   If you see any new or changing lesions before your next follow-up, please call to schedule a visit.  Please continue daily skin protection including broad spectrum sunscreen SPF 30+ to sun-exposed areas, reapplying every 2 hours as needed when you're outdoors.   Staying in the shade or wearing long sleeves, sun glasses (UVA+UVB protection) and wide brim hats (4-inch brim around the entire circumference of the hat) are also recommended for sun protection.     Due to recent changes in healthcare laws, you may see results of your pathology and/or laboratory studies on MyChart before the doctors have had a chance to review them. We  understand that in some cases there may be results that are confusing or concerning to you. Please understand that not all results are received at the same time and often the doctors may need to interpret multiple results in order to provide you with the best plan of care or course of treatment. Therefore, we ask that you please give [place] 2 business days to thoroughly review all your results before contacting the office for clarification. Should we see a critical lab result, you will be contacted sooner.   If You Need Anything After Your Visit  If you have any questions or concerns for your doctor, please call our main line at [phone] and press option 4 to reach your doctor's medical assistant. If no one answers, please leave a voicemail as directed and we will return your call as soon as possible. Messages left after 4 pm will be answered the following business day.   You may also send [place] a message via MyChart. We typically respond to MyChart messages within 1-2 business days.  For prescription refills, please ask your pharmacy  to contact our office. Our fax number is [phone].  If you have an urgent issue when the clinic is closed that cannot wait until the next business day, you can page your doctor at the number below.    Please note that while we do our best to be available for urgent issues outside of office hours, we are not available 24/7.   If you have an urgent issue and are unable to reach [place], you may choose to seek medical care at your doctor's office, retail clinic, urgent care center, or emergency room.  If you have a medical emergency, please immediately call 911 or go to the emergency department.  Pager Numbers  - Dr. Gwen Pounds: [phone]  - Dr. Roseanne Reno: [phone]  - Dr. Katrinka Blazing: [phone]   In the event of inclement weather, please call our main line at [phone] for an update on the status of any delays or closures.  Dermatology Medication Tips: Please  keep the boxes that topical medications come in in order to help keep track of the instructions about where and how to use these. Pharmacies typically print the medication instructions only on the boxes and not directly on the medication tubes.   If your medication is too expensive, please contact our office at [phone] option 4 or send [place] a message through MyChart.   We are unable to tell what your co-pay for medications will be in advance as this is different depending on your insurance coverage. However, we may be able to find a substitute medication at lower cost or fill out paperwork to get insurance to cover a needed medication.   If a prior authorization is required to get your medication covered by your insurance company, please allow [place] 1-2 business days to complete this process.  Drug prices often vary depending on where the prescription is filled and some pharmacies may offer cheaper prices.  The website www.goodrx.com contains coupons for medications through different pharmacies. The prices here do not account for what the cost may be with help from insurance (it may be cheaper with your insurance), but the website can give you the price if you did not use any insurance.  - You can print the associated coupon and take it with your prescription to the pharmacy.  - You may also stop by our office during regular business hours and pick up a GoodRx coupon card.  - If you need your prescription sent electronically to a different pharmacy, notify our office through [facility] or by phone at [phone] option 4.     Si Usted Necesita Algo Despus de Su Visita  Tambin puede enviarnos un mensaje a travs de Clinical cytogeneticist. Por lo general respondemos a los mensajes de MyChart en el transcurso de 1 a 2 das hbiles.  Para renovar recetas, por favor pida a su farmacia que se ponga en contacto con nuestra oficina. Annie Sable de fax es [place] [phone].  Si tiene un asunto urgente  cuando la clnica est cerrada y que no puede esperar hasta el siguiente da hbil, puede llamar/localizar a su doctor(a) al nmero que aparece a continuacin.   Por favor, tenga en cuenta que aunque hacemos todo lo posible para estar disponibles para asuntos urgentes fuera del horario de [place], no estamos disponibles las 24 horas del da, los 7 [address]  [address] de la [place].   Si tiene un problema urgente y no puede comunicarse con nosotros, puede optar por buscar atencin mdica  en el consultorio de su doctor(a), en  una clnica privada, en un centro de atencin urgente o en una sala de emergencias.  Si tiene Engineer, drilling, por favor llame inmediatamente al 911 o vaya a la sala de emergencias.  Nmeros de bper  - Dr. Gwen Pounds: [phone]  - Dra. Roseanne Reno: [phone]  - Dr. Katrinka Blazing: [phone]   En caso de inclemencias del tiempo, por favor llame a Lacy Duverney principal al [phone] para una actualizacin sobre el [place] de cualquier retraso o cierre.  Consejos para la medicacin en dermatologa: Por favor, guarde las cajas en las que vienen los medicamentos de uso tpico para ayudarle a seguir las instrucciones sobre dnde y cmo usarlos. Las farmacias generalmente imprimen las instrucciones del medicamento slo en las cajas y no directamente en los tubos del [place].   Si su medicamento es muy caro, por favor, pngase en contacto con Rolm [place] llamando al [phone] y presione la opcin 4 o envenos un mensaje a travs de Clinical cytogeneticist.   No podemos decirle cul ser su copago por los medicamentos por adelantado ya que esto es diferente dependiendo de la cobertura de su seguro. Sin embargo, es posible que podamos encontrar un medicamento sustituto a Audiological scientist un formulario para que el seguro cubra el medicamento que se considera necesario.   Si se requiere una autorizacin previa para que su compaa de seguros [place] su medicamento, por favor permtanos de 1 a 2  das hbiles para completar [address]  Los precios de los medicamentos varan con frecuencia dependiendo del Environmental consultant de dnde se surte la receta y alguna farmacias pueden ofrecer precios ms baratos.  El sitio web www.goodrx.com tiene cupones para medicamentos de Health and safety inspector. Los precios aqu no tienen en cuenta lo que podra costar con la ayuda del seguro (puede ser ms barato con su seguro), pero el sitio web puede darle el precio si no utiliz Tourist information centre manager.  - Puede imprimir el cupn correspondiente y llevarlo con su receta a la farmacia.  - Tambin puede pasar por nuestra oficina durante el horario de atencin regular y Education officer, museum una tarjeta de cupones de GoodRx.  - Si necesita que su receta se enve electrnicamente a una farmacia diferente, informe a nuestra oficina a travs de MyChart de [place] o por telfono llamando al [phone] y presione la opcin 4.

## 2024-06-19 NOTE — Progress Notes (Signed)
 Follow-Up Visit   Subjective  Lisa Russell is a 68 y.o. female who presents for the following: Skin Cancer Screening and Full Body Skin Exam  The patient presents for Total-Body Skin Exam (TBSE) for skin cancer screening and mole check. The patient has spots, moles and lesions to be evaluated, some may be new or changing and the patient may have concern these could be cancer. She has a few bumps on her forehead that have been there for a month or two. She has a history of rosacea and has used metronidazole  cream, but not daily. History of BCC and Dysplastic nevi.    The following portions of the chart were reviewed this encounter and updated as appropriate: medications, allergies, medical history  Review of Systems:  No other skin or systemic complaints except as noted in HPI or Assessment and Plan.  Objective  Well appearing patient in no apparent distress; mood and affect are within normal limits.  A full examination was performed including scalp, head, eyes, ears, nose, lips, neck, chest, axillae, abdomen, back, buttocks, bilateral upper extremities, bilateral lower extremities, hands, feet, fingers, toes, fingernails, and toenails. All findings within normal limits unless otherwise noted below.   Relevant physical exam findings are noted in the Assessment and Plan.    Assessment & Plan   SKIN CANCER SCREENING PERFORMED TODAY.  ACTINIC DAMAGE - Chronic condition, secondary to cumulative UV/sun exposure - diffuse scaly erythematous macules with underlying dyspigmentation - Recommend daily broad spectrum sunscreen SPF 30+ to sun-exposed areas, reapply every 2 hours as needed.  - Staying in the shade or wearing long sleeves, sun glasses (UVA+UVB protection) and wide brim hats (4-inch brim around the entire circumference of the hat) are also recommended for sun protection.  - Call for new or changing lesions.  LENTIGINES, SEBORRHEIC KERATOSES, HEMANGIOMAS - Benign normal skin  lesions - Benign-appearing - Call for any changes  MELANOCYTIC NEVI - Tan-brown and/or pink-flesh-colored symmetric macules and papules - 5.0 mm med dark brown macule left areola - 5.0 mm med brown macule central upper abdomen - Benign appearing on exam today - Observation - Call clinic for new or changing moles - Recommend daily use of broad spectrum spf 30+ sunscreen to sun-exposed areas.   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  - Call if any new or changing lesions are noted between office visits   HISTORY OF DYSPLASTIC NEVUS Right inf scapula, severe, exc 03/23/2020 Left upper back 3.5 cm lat to spine, mod, 2020 No evidence of recurrence today Recommend regular full body skin exams Recommend daily broad spectrum sunscreen SPF 30+ to sun-exposed areas, reapply every 2 hours as needed.  Call if any new or changing lesions are noted between office visits  Sebaceous Hyperplasia - Small yellow papules with a central dell at forehead - Benign-appearing - Observe. Call for changes. Discussed ED if symptomatic.  Discussed cosmetic procedure, noncovered.  $60 for 1st lesion and $15 for each additional lesion if done on the same day.  Maximum charge $350.  One touch-up treatment included no charge. Discussed risks of treatment including dyspigmentation, small scar, and/or recurrence. Recommend daily broad spectrum sunscreen SPF 30+/photoprotection to treated areas once healed.  ROSACEA Exam Mild erythema at mid face, nose, chin. History of redness in the scalp,  clear today.   Chronic and persistent condition with duration or expected duration over one year. Condition is improving with treatment but not currently at goal.   Rosacea is a chronic progressive skin  condition usually affecting the face of adults, causing redness and/or acne bumps. It is treatable but not curable. It sometimes affects the eyes (ocular rosacea) as well. It may respond to topical and/or systemic medication and can flare with stress, sun exposure, alcohol, exercise, topical steroids (including hydrocortisone/cortisone 10) and some foods.  Daily application of broad spectrum spf 30+ sunscreen to face is recommended to reduce flares.  Treatment Plan Continue metronidazole  0.75% cream, but recommend using more consistently at least once daily, twice daily with flares.  HISTORY OF PRECANCEROUS ACTINIC KERATOSIS - site(s) of PreCancerous Actinic Keratosis clear today. - these may recur and new lesions may form requiring treatment to prevent transformation into skin cancer - observe for new or changing spots and contact [facility] for appointment if occur - photoprotection with sun protective clothing; sunglasses and broad spectrum sunscreen with SPF of at least 30 + and frequent self skin exams recommended - yearly exams by a dermatologist recommended for persons with history of PreCancerous Actinic Keratoses   Return in about 6 months (around 12/20/2024) for TBSE, Hx BCC, Hx Dysplastic Nevus, Hx AKs. Pt is anxious and prefers to cont q 6 mo for skin check. SABRA LILLETTE Andrea Ezzard, CMA, am acting as scribe for Rexene Rattler, MD .   Documentation: I have reviewed the above documentation for accuracy and completeness, and I agree with the above.  Rexene Rattler, MD

## 2024-08-04 ENCOUNTER — Encounter: Payer: Self-pay | Admitting: Dermatology

## 2024-09-08 ENCOUNTER — Encounter: Payer: Self-pay | Admitting: Dermatology

## 2024-09-08 ENCOUNTER — Ambulatory Visit (INDEPENDENT_AMBULATORY_CARE_PROVIDER_SITE_OTHER): Admitting: Dermatology

## 2024-09-08 DIAGNOSIS — W908XXA Exposure to other nonionizing radiation, initial encounter: Secondary | ICD-10-CM

## 2024-09-08 DIAGNOSIS — L820 Inflamed seborrheic keratosis: Secondary | ICD-10-CM | POA: Diagnosis not present

## 2024-09-08 DIAGNOSIS — L578 Other skin changes due to chronic exposure to nonionizing radiation: Secondary | ICD-10-CM | POA: Diagnosis not present

## 2024-09-08 DIAGNOSIS — L821 Other seborrheic keratosis: Secondary | ICD-10-CM | POA: Diagnosis not present

## 2024-09-08 NOTE — Progress Notes (Signed)
   Follow-Up Visit   Subjective  Lisa Russell is a 68 y.o. female who presents for the following: check spot R preauricular, pt not sure how long she has had but feels like it is raised now and gets irritated by her glasses, check mole L leg, noticed ~2 wks ago   The following portions of the chart were reviewed this encounter and updated as appropriate: medications, allergies, medical history  Review of Systems:  No other skin or systemic complaints except as noted in HPI or Assessment and Plan.  Objective  Well appearing patient in no apparent distress; mood and affect are within normal limits.   A focused examination was performed of the following areas: Face, left leg  Relevant exam findings are noted in the Assessment and Plan.  R preauricular x 1 Stuck on waxy paps with erythema  Assessment & Plan   ACTINIC DAMAGE - chronic, secondary to cumulative UV radiation exposure/sun exposure over time - diffuse scaly erythematous macules with underlying dyspigmentation face - Recommend daily broad spectrum sunscreen SPF 30+ to sun-exposed areas, reapply every 2 hours as needed.  - Recommend staying in the shade or wearing long sleeves, sun glasses (UVA+UVB protection) and wide brim hats (4-inch brim around the entire circumference of the hat). - Call for new or changing lesions.   SEBORRHEIC KERATOSIS L popliteal  - Stuck-on, waxy, tan-brown papule - Benign-appearing - Discussed benign etiology and prognosis. - Observe - Call for any changes  INFLAMED SEBORRHEIC KERATOSIS R preauricular x 1 Symptomatic, irritating, patient would like treated. Destruction of lesion - R preauricular x 1  Destruction method: cryotherapy   Informed consent: discussed and consent obtained   Lesion destroyed using liquid nitrogen: Yes   Region frozen until ice ball extended beyond lesion: Yes   Outcome: patient tolerated procedure well with no complications   Post-procedure details: wound  care instructions given   Additional details:  Prior to procedure, discussed risks of blister formation, small wound, skin dyspigmentation, or rare scar following cryotherapy. Recommend Vaseline ointment to treated areas while healing.    Return for as scheduled for , TBSE, Hx of BCC, Hx of Dysplastic nevi.  I, Grayce Saunas, RMA, am acting as scribe for Rexene Rattler, MD .   Documentation: I have reviewed the above documentation for accuracy and completeness, and I agree with the above.  Rexene Rattler, MD

## 2024-09-08 NOTE — Patient Instructions (Addendum)

## 2024-12-03 ENCOUNTER — Encounter: Payer: Self-pay | Admitting: Internal Medicine

## 2024-12-03 DIAGNOSIS — Z1231 Encounter for screening mammogram for malignant neoplasm of breast: Secondary | ICD-10-CM

## 2024-12-04 ENCOUNTER — Encounter: Payer: Self-pay | Admitting: Internal Medicine

## 2024-12-04 ENCOUNTER — Other Ambulatory Visit: Payer: Self-pay | Admitting: Internal Medicine

## 2024-12-04 DIAGNOSIS — N644 Mastodynia: Secondary | ICD-10-CM

## 2024-12-10 ENCOUNTER — Ambulatory Visit
Admission: RE | Admit: 2024-12-10 | Discharge: 2024-12-10 | Disposition: A | Discharge status: Home / Self Care | Source: Ambulatory Visit | Attending: Internal Medicine | Admitting: Internal Medicine

## 2024-12-10 ENCOUNTER — Ambulatory Visit
Admission: RE | Admit: 2024-12-10 | Discharge: 2024-12-10 | Disposition: A | Source: Ambulatory Visit | Attending: Internal Medicine | Admitting: Internal Medicine

## 2024-12-10 DIAGNOSIS — N644 Mastodynia: Secondary | ICD-10-CM | POA: Insufficient documentation

## 2024-12-29 ENCOUNTER — Encounter: Admitting: Dermatology
